# Patient Record
Sex: Female | Born: 1960 | Race: White | Hispanic: No | Marital: Married | Smoking: Former smoker
Health system: Southern US, Community
[De-identification: ages and names within clinical notes are randomized; demographics above are authoritative.]

## PROBLEM LIST (undated history)

## (undated) DIAGNOSIS — E785 Hyperlipidemia, unspecified: Secondary | ICD-10-CM

## (undated) DIAGNOSIS — Z973 Presence of spectacles and contact lenses: Secondary | ICD-10-CM

## (undated) DIAGNOSIS — Z9889 Other specified postprocedural states: Secondary | ICD-10-CM

## (undated) DIAGNOSIS — J3089 Other allergic rhinitis: Secondary | ICD-10-CM

## (undated) DIAGNOSIS — R112 Nausea with vomiting, unspecified: Secondary | ICD-10-CM

## (undated) DIAGNOSIS — K219 Gastro-esophageal reflux disease without esophagitis: Secondary | ICD-10-CM

## (undated) DIAGNOSIS — S83207A Unspecified tear of unspecified meniscus, current injury, left knee, initial encounter: Secondary | ICD-10-CM

## (undated) DIAGNOSIS — N393 Stress incontinence (female) (male): Secondary | ICD-10-CM

---

## 1988-03-19 HISTORY — PX: PILONIDAL CYST EXCISION: SHX744

## 1997-10-18 ENCOUNTER — Other Ambulatory Visit: Admission: RE | Admit: 1997-10-18 | Discharge: 1997-10-18 | Payer: Self-pay | Admitting: Obstetrics & Gynecology

## 1999-05-08 ENCOUNTER — Other Ambulatory Visit: Admission: RE | Admit: 1999-05-08 | Discharge: 1999-05-08 | Payer: Self-pay | Admitting: Obstetrics and Gynecology

## 2000-04-09 ENCOUNTER — Ambulatory Visit (HOSPITAL_COMMUNITY): Admission: RE | Admit: 2000-04-09 | Discharge: 2000-04-09 | Payer: Self-pay | Admitting: Gastroenterology

## 2000-04-09 ENCOUNTER — Encounter: Payer: Self-pay | Admitting: Gastroenterology

## 2000-04-20 ENCOUNTER — Emergency Department (HOSPITAL_COMMUNITY): Admission: EM | Admit: 2000-04-20 | Discharge: 2000-04-20 | Payer: Self-pay | Admitting: Emergency Medicine

## 2000-04-20 ENCOUNTER — Encounter: Payer: Self-pay | Admitting: Emergency Medicine

## 2000-05-02 ENCOUNTER — Ambulatory Visit (HOSPITAL_COMMUNITY): Admission: RE | Admit: 2000-05-02 | Discharge: 2000-05-02 | Payer: Self-pay | Admitting: Gastroenterology

## 2000-05-02 ENCOUNTER — Encounter (INDEPENDENT_AMBULATORY_CARE_PROVIDER_SITE_OTHER): Payer: Self-pay | Admitting: Specialist

## 2000-08-08 ENCOUNTER — Other Ambulatory Visit: Admission: RE | Admit: 2000-08-08 | Discharge: 2000-08-08 | Payer: Self-pay | Admitting: Obstetrics and Gynecology

## 2000-08-09 ENCOUNTER — Other Ambulatory Visit: Admission: RE | Admit: 2000-08-09 | Discharge: 2000-08-09 | Payer: Self-pay | Admitting: Obstetrics and Gynecology

## 2000-08-09 ENCOUNTER — Encounter (INDEPENDENT_AMBULATORY_CARE_PROVIDER_SITE_OTHER): Payer: Self-pay

## 2003-05-13 ENCOUNTER — Encounter: Admission: RE | Admit: 2003-05-13 | Discharge: 2003-05-13 | Payer: Self-pay | Admitting: Obstetrics and Gynecology

## 2003-05-28 ENCOUNTER — Other Ambulatory Visit: Admission: RE | Admit: 2003-05-28 | Discharge: 2003-05-28 | Payer: Self-pay | Admitting: Obstetrics and Gynecology

## 2004-06-15 ENCOUNTER — Encounter: Admission: RE | Admit: 2004-06-15 | Discharge: 2004-06-15 | Payer: Self-pay | Admitting: Obstetrics and Gynecology

## 2004-06-30 ENCOUNTER — Encounter: Admission: RE | Admit: 2004-06-30 | Discharge: 2004-06-30 | Payer: Self-pay | Admitting: Obstetrics and Gynecology

## 2004-07-03 ENCOUNTER — Encounter: Admission: RE | Admit: 2004-07-03 | Discharge: 2004-07-03 | Payer: Self-pay | Admitting: Obstetrics and Gynecology

## 2004-07-03 ENCOUNTER — Encounter (INDEPENDENT_AMBULATORY_CARE_PROVIDER_SITE_OTHER): Payer: Self-pay | Admitting: *Deleted

## 2004-07-28 ENCOUNTER — Ambulatory Visit (HOSPITAL_COMMUNITY): Admission: RE | Admit: 2004-07-28 | Discharge: 2004-07-28 | Payer: Self-pay | Admitting: Surgery

## 2004-07-28 ENCOUNTER — Encounter (INDEPENDENT_AMBULATORY_CARE_PROVIDER_SITE_OTHER): Payer: Self-pay | Admitting: *Deleted

## 2004-07-28 ENCOUNTER — Ambulatory Visit (HOSPITAL_BASED_OUTPATIENT_CLINIC_OR_DEPARTMENT_OTHER): Admission: RE | Admit: 2004-07-28 | Discharge: 2004-07-28 | Payer: Self-pay | Admitting: Surgery

## 2004-07-28 ENCOUNTER — Encounter: Admission: RE | Admit: 2004-07-28 | Discharge: 2004-07-28 | Payer: Self-pay | Admitting: Surgery

## 2004-07-28 HISTORY — PX: OTHER SURGICAL HISTORY: SHX169

## 2004-08-07 ENCOUNTER — Other Ambulatory Visit: Admission: RE | Admit: 2004-08-07 | Discharge: 2004-08-07 | Payer: Self-pay | Admitting: Obstetrics and Gynecology

## 2005-07-27 ENCOUNTER — Encounter: Admission: RE | Admit: 2005-07-27 | Discharge: 2005-07-27 | Payer: Self-pay | Admitting: Obstetrics and Gynecology

## 2005-08-08 ENCOUNTER — Other Ambulatory Visit: Admission: RE | Admit: 2005-08-08 | Discharge: 2005-08-08 | Payer: Self-pay | Admitting: Obstetrics and Gynecology

## 2006-07-29 ENCOUNTER — Encounter: Admission: RE | Admit: 2006-07-29 | Discharge: 2006-07-29 | Payer: Self-pay | Admitting: Obstetrics and Gynecology

## 2007-10-10 ENCOUNTER — Encounter: Admission: RE | Admit: 2007-10-10 | Discharge: 2007-10-10 | Payer: Self-pay | Admitting: Obstetrics and Gynecology

## 2009-04-11 ENCOUNTER — Encounter: Admission: RE | Admit: 2009-04-11 | Discharge: 2009-04-11 | Payer: Self-pay | Admitting: Allergy and Immunology

## 2009-05-28 ENCOUNTER — Ambulatory Visit: Payer: Self-pay | Admitting: Radiology

## 2009-05-28 ENCOUNTER — Emergency Department (HOSPITAL_BASED_OUTPATIENT_CLINIC_OR_DEPARTMENT_OTHER): Admission: EM | Admit: 2009-05-28 | Discharge: 2009-05-28 | Payer: Self-pay | Admitting: Emergency Medicine

## 2009-07-12 ENCOUNTER — Encounter: Admission: RE | Admit: 2009-07-12 | Discharge: 2009-07-12 | Payer: Self-pay | Admitting: Obstetrics and Gynecology

## 2010-04-09 ENCOUNTER — Encounter: Payer: Self-pay | Admitting: Obstetrics and Gynecology

## 2010-04-10 ENCOUNTER — Encounter: Payer: Self-pay | Admitting: Obstetrics and Gynecology

## 2010-07-03 ENCOUNTER — Other Ambulatory Visit: Payer: Self-pay | Admitting: Obstetrics and Gynecology

## 2010-07-03 DIAGNOSIS — Z1231 Encounter for screening mammogram for malignant neoplasm of breast: Secondary | ICD-10-CM

## 2010-07-14 ENCOUNTER — Ambulatory Visit: Payer: Self-pay

## 2010-08-02 ENCOUNTER — Ambulatory Visit
Admission: RE | Admit: 2010-08-02 | Discharge: 2010-08-02 | Disposition: A | Discharge status: Home / Self Care | Payer: BC Managed Care – PPO | Source: Ambulatory Visit | Attending: Obstetrics and Gynecology | Admitting: Obstetrics and Gynecology

## 2010-08-02 DIAGNOSIS — Z1231 Encounter for screening mammogram for malignant neoplasm of breast: Secondary | ICD-10-CM

## 2010-08-04 NOTE — Op Note (Signed)
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   OPERATION:  Needle-guided excision of left breast mass.   SURGEON:  Cyndia Bent, M.D.   ANESTHESIA:  MAC.   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 After discussion  with the patient, she elected to proceed to an excisional biopsy.   DESCRIPTION OF PROCEDURE:  The patient was seen in the holding area and had  no further questions. She had already marked the left breast as the  operative site and I did the same. Her guidewire had already been placed and  I reviewed those films. She had no further questions.   The patient was taken to the operating room, and after satisfactory IV  sedation, the breast was ultrasounded to confirm the location of the mass in  relation to the guidewire. It appeared to be at about the six to seven  o'clock position at the areolar margin. The guide wire entered at about the  8:30 position and tracked from medial towards the nipple and towards the  mass.   After the area was prepped and draped, the time-out occurred. One percent  Xylocaine was then infiltrated and a curvilinear incision at the areolar  margin was made starting at about the 10 o'clock position and going  inferiorly and around to about the 5:30 position. Subcutaneous tissues were  entered and the  guidewire manipulated into the wound. Using cautery, I  traced it into the breast tissue a little ways and then when I saw the  thickened part of the wire, I knew we were very close to the mass. I put a  suture through the distal tissue to use as traction and pulling up on this,  was able to palpate the mass and then using cautery, excised it with narrow  rim of what appeared to be normal breast tissue around it. This was sent for  specimen mammography.   Meanwhile, the wound was further anesthetized to be sure we had good  postoperative pain relief and hemostasis achieved with cautery. It was  closed with 3-0 Vicryl followed by 4-0 Monocryl subcuticular.   Dr. Kearney Hard reported that the mammogram showed the mass within the middle of  the specimen. This concluded the case and Dermabond was applied. The patient  was taken to the recovery room in satisfactory condition. There no operative  complications. All counts were correct.      CJS/MEDQ  D:  07/28/2004  T:  07/28/2004  Job:  [id number]   cc:   Huel Cote, M.D.  [address] [place], [address]  [place], [place] [address]  Fax: [phone]

## 2011-04-21 ENCOUNTER — Encounter (HOSPITAL_BASED_OUTPATIENT_CLINIC_OR_DEPARTMENT_OTHER): Payer: Self-pay | Admitting: *Deleted

## 2011-04-21 ENCOUNTER — Emergency Department (HOSPITAL_BASED_OUTPATIENT_CLINIC_OR_DEPARTMENT_OTHER)
Admission: EM | Admit: 2011-04-21 | Discharge: 2011-04-21 | Disposition: A | Discharge status: Home / Self Care | Payer: BC Managed Care – PPO | Attending: Emergency Medicine | Admitting: Emergency Medicine

## 2011-04-21 DIAGNOSIS — W260XXA Contact with knife, initial encounter: Secondary | ICD-10-CM | POA: Insufficient documentation

## 2011-04-21 DIAGNOSIS — Y92009 Unspecified place in unspecified non-institutional (private) residence as the place of occurrence of the external cause: Secondary | ICD-10-CM | POA: Insufficient documentation

## 2011-04-21 DIAGNOSIS — T07XXXA Unspecified multiple injuries, initial encounter: Secondary | ICD-10-CM

## 2011-04-21 DIAGNOSIS — J45909 Unspecified asthma, uncomplicated: Secondary | ICD-10-CM | POA: Insufficient documentation

## 2011-04-21 DIAGNOSIS — Y93G1 Activity, food preparation and clean up: Secondary | ICD-10-CM | POA: Insufficient documentation

## 2011-04-21 DIAGNOSIS — S61209A Unspecified open wound of unspecified finger without damage to nail, initial encounter: Secondary | ICD-10-CM | POA: Insufficient documentation

## 2011-04-21 MED ORDER — LIDOCAINE HCL (PF) 1 % IJ SOLN
INTRAMUSCULAR | Status: AC
  Administered 2011-04-21: 5 mL via INTRADERMAL
  Filled 2011-04-21: qty 5

## 2011-04-21 MED ORDER — LIDOCAINE HCL (PF) 1 % IJ SOLN
5.0000 mL | Freq: Once | INTRAMUSCULAR | Status: AC
  Administered 2011-04-21: 5 mL via INTRADERMAL
  Filled 2011-04-21: qty 5

## 2011-04-21 MED ORDER — TETANUS-DIPHTH-ACELL PERTUSSIS 5-2.5-18.5 LF-MCG/0.5 IM SUSP
0.5000 mL | Freq: Once | INTRAMUSCULAR | Status: AC
  Administered 2011-04-21: 0.5 mL via INTRAMUSCULAR
  Filled 2011-04-21: qty 0.5

## 2011-04-21 NOTE — ED Provider Notes (Signed)
History     CSN: [id number]  Arrival date & time 04/21/11  2128   First MD Initiated Contact with Patient 04/21/11 2141      Chief Complaint  Patient presents with  . 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 There was no treatment prior to arrival other than localized pressure to control the bleeding. This injury occurred just prior to arrival. Past Medical History  Diagnosis Date  . Asthma     Past Surgical History  Procedure Date  . Cesarean section     History reviewed. No pertinent family history.  History  Substance Use Topics  . Smoking status: Never Smoker   . Smokeless tobacco: Not on file  . Alcohol Use: Yes    OB History    Grav Para Term Preterm Abortions TAB SAB Ect Mult Living                  Review of Systems  Constitutional: Negative for fever.       10 Systems reviewed and are negative for acute change except as noted in the HPI.  Respiratory: Negative for shortness of breath.   Cardiovascular: Negative for chest pain.  Gastrointestinal: Negative for abdominal pain.  Musculoskeletal: Negative for back pain.  Skin: Negative for rash.  Neurological: Negative for weakness and numbness.  Psychiatric/Behavioral:       No behavior change.    Allergies  Review of patient's allergies indicates no known allergies.  Home Medications   Current Outpatient Rx  Name Route Sig Dispense Refill  .  ALBUTEROL SULFATE HFA 108 (90 BASE) MCG/ACT IN AERS Inhalation Inhale 2 puffs into the lungs every 6 (six) hours as needed. For shortness of breath and wheezing    . BUDESONIDE-FORMOTEROL FUMARATE 160-4.5 MCG/ACT IN AERO Inhalation Inhale 2 puffs into the lungs 2 (two) times daily.    . IBUPROFEN 200 MG PO TABS Oral Take 400 mg by mouth every 6 (six) hours as needed. For pain    . ADULT MULTIVITAMIN W/MINERALS CH Oral Take 1 tablet by mouth daily.      BP 124/44  Pulse 78  Temp(Src) 97.7 F (36.5 C) (Oral)  Resp 20  Ht 6' (1.829 m)  Wt 254 lb (115.214 kg)  BMI 34.45 kg/m2  SpO2 100%  LMP 03/14/2011  Physical Exam  Nursing note and vitals reviewed. Constitutional:       Awake, alert, nontoxic appearance.  HENT:  Head: Atraumatic.  Eyes: Right eye exhibits no discharge. Left eye exhibits no discharge.  Neck: Neck supple.  Pulmonary/Chest: Effort normal. She exhibits no tenderness.  Abdominal: Soft. There is no tenderness. There is no rebound.  Musculoskeletal: She exhibits no tenderness.       Baseline ROM, no obvious new focal weakness. Right arm and both legs are nontender, left arm is minimal tenderness to the dorsum of the lacerated fingers otherwise is nontender. Examination of left hand reveals no bony or joint tenderness. She has capillary refill less than 2  seconds in all digits. She has 2 point discrimination intact in her affected small ring and long fingers. Her thumb and index finger are nontender and not injured. Her left long ring and small finger all have dorsal lacerations with full extension against resistance at 5 out of 5 strength as well as FDP and FDS strength testing 5/5 against resistance as well. The lacerations are 1 cm each on her small and long fingers and 2 cm on her ring finger.  Neurological: She is alert.       Mental status and motor strength appears baseline for patient and situation.  Skin: No rash noted.  Psychiatric: She has a normal mood and affect.      ED Course  Procedures (including critical care time) After time out was taken the patient's left hand was cleansed irrigated and wounds were anesthetized with 1% lidocaine for local anesthesia the wounds were explored with good hemostasis to range of motion no deeper structure involvement was noted such as bone joint or tendon or neurovascular involvement noted the wounds were then closed with 5-0 nylon 3 sutures each to the long and small fingers and 5 sutures to the ring finger which the patient tolerated well with no immediate complications noted for a total laceration length repair of 4 cm between the 3 affected digits. Labs Reviewed - No data to display No results found.   1. Lacerations of multiple sites without complication       MDM          Hurman Horn, MD 04/22/11 [phone]

## 2011-04-21 NOTE — ED Notes (Signed)
Pt states she was using a knife and accidentally cut 3 of her fingers. Approx 1 cm lacs to each. Bleeding controlled.

## 2012-12-17 ENCOUNTER — Emergency Department (HOSPITAL_COMMUNITY): Payer: BC Managed Care – PPO

## 2012-12-17 ENCOUNTER — Encounter (HOSPITAL_COMMUNITY): Payer: Self-pay | Admitting: Emergency Medicine

## 2012-12-17 ENCOUNTER — Emergency Department (HOSPITAL_COMMUNITY)
Admission: EM | Admit: 2012-12-17 | Discharge: 2012-12-17 | Disposition: A | Discharge status: Home / Self Care | Payer: BC Managed Care – PPO | Attending: Emergency Medicine | Admitting: Emergency Medicine

## 2012-12-17 DIAGNOSIS — R079 Chest pain, unspecified: Secondary | ICD-10-CM | POA: Insufficient documentation

## 2012-12-17 DIAGNOSIS — G43909 Migraine, unspecified, not intractable, without status migrainosus: Secondary | ICD-10-CM | POA: Insufficient documentation

## 2012-12-17 DIAGNOSIS — Z79899 Other long term (current) drug therapy: Secondary | ICD-10-CM | POA: Insufficient documentation

## 2012-12-17 DIAGNOSIS — J45909 Unspecified asthma, uncomplicated: Secondary | ICD-10-CM | POA: Insufficient documentation

## 2012-12-17 DIAGNOSIS — M7989 Other specified soft tissue disorders: Secondary | ICD-10-CM | POA: Insufficient documentation

## 2012-12-17 DIAGNOSIS — R0602 Shortness of breath: Secondary | ICD-10-CM | POA: Insufficient documentation

## 2012-12-17 DIAGNOSIS — Z91040 Latex allergy status: Secondary | ICD-10-CM | POA: Insufficient documentation

## 2012-12-17 LAB — POCT I-STAT TROPONIN I

## 2012-12-17 LAB — CBC
MCHC: 32.1 g/dL (ref 30.0–36.0)
RBC: 4.52 MIL/uL (ref 3.87–5.11)

## 2012-12-17 LAB — BASIC METABOLIC PANEL
Chloride: 100 mEq/L (ref 96–112)
Creatinine, Ser: 0.75 mg/dL (ref 0.50–1.10)
Potassium: 3.6 mEq/L (ref 3.5–5.1)

## 2012-12-17 MED ORDER — PROMETHAZINE HCL 25 MG/ML IJ SOLN
12.5000 mg | Freq: Once | INTRAMUSCULAR | Status: AC
  Administered 2012-12-17: 12.5 mg via INTRAVENOUS
  Filled 2012-12-17 (×2): qty 1

## 2012-12-17 MED ORDER — IOHEXOL 350 MG/ML SOLN
100.0000 mL | Freq: Once | INTRAVENOUS | Status: AC | PRN
  Administered 2012-12-17: 100 mL via INTRAVENOUS

## 2012-12-17 NOTE — ED Notes (Signed)
Pt states that her left arm has been feeling tight for the past couple of days, this morning pt's chest started hurting felt as if something was sitting on chest. Pt states that she is SOB on exertion at times.

## 2012-12-17 NOTE — ED Provider Notes (Signed)
CSN: [id number]     Arrival date & time 12/17/12  1191 History   First MD Initiated Contact with Patient 12/17/12 0901     Chief Complaint  Patient presents with  . Chest Pain   (Consider location/radiation/quality/duration/timing/severity/associated sxs/prior Treatment) Patient is a 52 y.o. female presenting with chest pain. The history is provided by the patient.  Chest Pain Associated symptoms: headache and shortness of breath   Associated symptoms: no abdominal pain, no back pain, no nausea, no numbness, not vomiting and no weakness    patient presents with right-sided chest pain. As dull. She states that she's had some left arm pain for last few days. She states today at work her arm is bothering her and she developed a left-sided headache. Patient states she's been having difficulty with activity for a couple weeks. She states is no chest pain with it. No fevers. She'll have a nonproductive cough. She states it is her asthma. No production with the cough. She developed right-sided chest pain today. She's also had a throbbing left-sided headache. She states it feels as if she is being stabbed her head and hurts with every heartbeat. She has a history of migraines but states this feels different. No numbness or weakness. No difficulty seen, however the light does make her headache worse. No nausea vomiting. She also states she's had swelling in both her legs. She states is worse on the left side. She states she's been giving fluid pills for this. She states this has been going on for a few weeks also. Patient's father has had a CABG at 63 years old.  Past Medical History  Diagnosis Date  . Asthma    Past Surgical History  Procedure Laterality Date  . Cesarean section     No family history on file. History  Substance Use Topics  . Smoking status: Never Smoker   . Smokeless tobacco: Not on file  . Alcohol Use: Yes   OB History   Grav Para Term Preterm Abortions TAB SAB Ect Mult  Living                 Review of Systems  Constitutional: Negative for activity change and appetite change.  HENT: Negative for neck stiffness.   Eyes: Negative for pain.  Respiratory: Positive for shortness of breath. Negative for chest tightness.   Cardiovascular: Positive for chest pain and leg swelling.  Gastrointestinal: Negative for nausea, vomiting, abdominal pain and diarrhea.  Genitourinary: Negative for flank pain.  Musculoskeletal: Negative for back pain.  Skin: Negative for rash.  Neurological: Positive for headaches. Negative for weakness and numbness.  Psychiatric/Behavioral: Negative for behavioral problems.    Allergies  Latex  Home Medications   Current Outpatient Rx  Name  Route  Sig  Dispense  Refill  . acetaminophen (TYLENOL) 500 MG tablet   Oral   Take 1,000 mg by mouth every 6 (six) hours as needed for pain (headache).         Marland Kitchen albuterol (PROVENTIL HFA;VENTOLIN HFA) 108 (90 BASE) MCG/ACT inhaler   Inhalation   Inhale 2 puffs into the lungs every 6 (six) hours as needed. For shortness of breath and wheezing         . aspirin-acetaminophen-caffeine (EXCEDRIN MIGRAINE) 250-250-65 MG per tablet   Oral   Take 2 tablets by mouth every 6 (six) hours as needed for pain.         . budesonide-formoterol (SYMBICORT) 160-4.5 MCG/ACT inhaler   Inhalation   Inhale 2  puffs into the lungs 2 (two) times daily.          BP 109/56  Pulse 75  Temp(Src) 98.9 F (37.2 C) (Oral)  Resp 20  SpO2 100%  LMP 03/14/2011 Physical Exam  Nursing note and vitals reviewed. Constitutional: She is oriented to person, place, and time. She appears well-developed and well-nourished.  HENT:  Head: Normocephalic and atraumatic.  Eyes: EOM are normal. Pupils are equal, round, and reactive to light.  Neck: Normal range of motion. Neck supple.  Cardiovascular: Normal rate, regular rhythm and normal heart sounds.   No murmur heard. Pulmonary/Chest: Effort normal and breath  sounds normal. No respiratory distress. She has no wheezes. She has no rales.  Abdominal: Soft. Bowel sounds are normal. She exhibits no distension. There is no tenderness. There is no rebound and no guarding.  Musculoskeletal: Normal range of motion. She exhibits edema.  Pitting edema to bilateral lower extremities. Worse on the left.  Neurological: She is alert and oriented to person, place, and time. No cranial nerve deficit.  Skin: Skin is warm and dry.  Psychiatric: She has a normal mood and affect. Her speech is normal.    ED Course  Procedures (including critical care time) Labs Review Labs Reviewed  BASIC METABOLIC PANEL - Abnormal; Notable for the following:    Glucose, Bld 105 (*)    All other components within normal limits  CBC  PRO B NATRIURETIC PEPTIDE  POCT I-STAT TROPONIN I  POCT I-STAT TROPONIN I   Imaging Review Dg Chest 2 View  12/17/2012   CLINICAL DATA:  Chest pain  EXAM: CHEST  2 VIEW  COMPARISON:  04/11/2009  FINDINGS: Lungs are clear. No pleural effusion or pneumothorax.  The heart is normal in size.  Visualized osseous structures are within normal limits.  IMPRESSION: No evidence of acute cardiopulmonary disease.   Electronically Signed   By: Charline Bills M.D.   On: 12/17/2012 09:46   Ct Angio Chest W/cm &/or Wo Cm  12/17/2012   CLINICAL DATA:  Shortness of breath, cough, and left leg pain.  EXAM: CT ANGIOGRAPHY CHEST WITH CONTRAST  TECHNIQUE: Multidetector CT imaging of the chest was performed using the standard protocol during bolus administration of intravenous contrast. Multiplanar CT image reconstructions including MIPs were obtained to evaluate the vascular anatomy.  CONTRAST:  OMNIPAQUE IOHEXOL 350 MG/ML SOLN  COMPARISON:  Chest x-ray dated 12/17/2012  FINDINGS: There are no pulmonary emboli, infiltrates, or effusions. Heart size is within normal limits. No osseous abnormality. There is a small amount of pericardial fluid around the root of the  main pulmonary artery and thoracic aorta. No adenopathy. The visualized portion of the upper abdomen is normal.  Review of the MIP images confirms the above findings.  IMPRESSION: No pulmonary emboli or other significant abnormalities.   Electronically Signed   By: Geanie Cooley   On: 12/17/2012 11:28    Date: 12/17/2012  Rate: 83   Rhythm: normal sinus rhythm  QRS Axis: normal  Intervals: normal  ST/T Wave abnormalities: normal  Conduction Disutrbances: none  Narrative Interpretation: unremarkable    MDM   1. Chest pain    Patient with pain in her right chest and left upper extremity. Also had a headache. Feels better after treatment. EKG reassuring. CT angiography done due 2 right-sided pain. No pulmonary embolism. Cardiac enzymes negative x2. Will discharge and follow with cardiology.    Juliet Rude. Rubin Payor, MD 12/17/12 1331

## 2012-12-25 ENCOUNTER — Other Ambulatory Visit: Payer: Self-pay

## 2012-12-25 DIAGNOSIS — Z1231 Encounter for screening mammogram for malignant neoplasm of breast: Secondary | ICD-10-CM

## 2012-12-31 ENCOUNTER — Ambulatory Visit: Payer: BC Managed Care – PPO | Admitting: Cardiovascular Disease

## 2013-01-02 ENCOUNTER — Encounter (HOSPITAL_COMMUNITY): Payer: Self-pay | Admitting: *Deleted

## 2013-01-02 ENCOUNTER — Encounter: Payer: Self-pay | Admitting: Cardiovascular Disease

## 2013-01-02 ENCOUNTER — Ambulatory Visit (INDEPENDENT_AMBULATORY_CARE_PROVIDER_SITE_OTHER): Payer: BC Managed Care – PPO | Admitting: Cardiovascular Disease

## 2013-01-02 VITALS — BP 138/80 | Ht 71.0 in | Wt 275.9 lb

## 2013-01-02 DIAGNOSIS — R079 Chest pain, unspecified: Secondary | ICD-10-CM

## 2013-01-02 DIAGNOSIS — E669 Obesity, unspecified: Secondary | ICD-10-CM

## 2013-01-02 DIAGNOSIS — R002 Palpitations: Secondary | ICD-10-CM

## 2013-01-02 DIAGNOSIS — K219 Gastro-esophageal reflux disease without esophagitis: Secondary | ICD-10-CM

## 2013-01-02 DIAGNOSIS — N951 Menopausal and female climacteric states: Secondary | ICD-10-CM

## 2013-01-02 DIAGNOSIS — Z8249 Family history of ischemic heart disease and other diseases of the circulatory system: Secondary | ICD-10-CM

## 2013-01-02 MED ORDER — PANTOPRAZOLE SODIUM 40 MG PO TBEC: 40.0000 mg | DELAYED_RELEASE_TABLET | Freq: Every day | ORAL | Status: DC

## 2013-01-02 NOTE — Patient Instructions (Addendum)
Your physician has requested that you have an echocardiogram. Echocardiography is a painless test that uses sound waves to create images of your heart. It provides your doctor with information about the size and shape of your heart and how well your heart's chambers and valves are working. This procedure takes approximately one hour. There are no restrictions for this procedure.  Your physician has requested that you have en exercise stress myoview. For further information please visit [email or web address] Please follow instruction sheet, as given.   Your physician recommends that you return for lab work fasting prior to next appointment.  Your physician has recommended you make the following change in your medication: start new prescription for pantoprazole. This hs been sent to your pharmacy.  Your physician recommends that you schedule a follow-up appointment in: 4 WEEKS.

## 2013-01-05 LAB — BASIC METABOLIC PANEL
CO2: 27 mEq/L (ref 19–32)
Calcium: 8.7 mg/dL (ref 8.4–10.5)
Chloride: 104 mEq/L (ref 96–112)
Glucose, Bld: 90 mg/dL (ref 70–99)
Potassium: 4.4 mEq/L (ref 3.5–5.3)
Sodium: 139 mEq/L (ref 135–145)

## 2013-01-05 LAB — MAGNESIUM: Magnesium: 1.9 mg/dL (ref 1.5–2.5)

## 2013-01-06 LAB — NMR LIPOPROFILE WITH LIPIDS
Cholesterol, Total: 188 mg/dL (ref <200)
HDL Particle Number: 31.5 umol/L (ref >=30.5)
HDL Size: 9.1 nm — ABNORMAL LOW (ref >=9.2)
HDL-C: 49 mg/dL (ref >=40)
Large HDL-P: 6.9 umol/L (ref >=4.8)
Large VLDL-P: 6.2 nmol/L — ABNORMAL HIGH (ref <=2.7)
Triglycerides: 136 mg/dL (ref <150)

## 2013-01-07 ENCOUNTER — Ambulatory Visit (HOSPITAL_COMMUNITY)
Admission: RE | Admit: 2013-01-07 | Discharge: 2013-01-07 | Disposition: A | Discharge status: Home / Self Care | Payer: BC Managed Care – PPO | Source: Ambulatory Visit | Attending: Cardiovascular Disease | Admitting: Cardiovascular Disease

## 2013-01-07 DIAGNOSIS — R5381 Other malaise: Secondary | ICD-10-CM | POA: Insufficient documentation

## 2013-01-07 DIAGNOSIS — R0602 Shortness of breath: Secondary | ICD-10-CM | POA: Insufficient documentation

## 2013-01-07 DIAGNOSIS — Z8249 Family history of ischemic heart disease and other diseases of the circulatory system: Secondary | ICD-10-CM

## 2013-01-07 DIAGNOSIS — M79609 Pain in unspecified limb: Secondary | ICD-10-CM | POA: Insufficient documentation

## 2013-01-07 DIAGNOSIS — R0989 Other specified symptoms and signs involving the circulatory and respiratory systems: Secondary | ICD-10-CM | POA: Insufficient documentation

## 2013-01-07 DIAGNOSIS — R002 Palpitations: Secondary | ICD-10-CM | POA: Insufficient documentation

## 2013-01-07 DIAGNOSIS — R079 Chest pain, unspecified: Secondary | ICD-10-CM

## 2013-01-07 DIAGNOSIS — R0609 Other forms of dyspnea: Secondary | ICD-10-CM | POA: Insufficient documentation

## 2013-01-07 HISTORY — PX: CARDIOVASCULAR STRESS TEST: SHX262

## 2013-01-07 MED ORDER — TECHNETIUM TC 99M SESTAMIBI GENERIC - CARDIOLITE
30.8000 | Freq: Once | INTRAVENOUS | Status: AC | PRN
  Administered 2013-01-07: 30.8 via INTRAVENOUS

## 2013-01-07 MED ORDER — TECHNETIUM TC 99M SESTAMIBI GENERIC - CARDIOLITE
10.6000 | Freq: Once | INTRAVENOUS | Status: AC | PRN
  Administered 2013-01-07: 11 via INTRAVENOUS

## 2013-01-07 NOTE — Procedures (Addendum)
[facility] [address] [place] [address] [phone]  Cardiology Nuclear Med Study  Melinda Reeves is a 52 y.o. female     MRN : [id number]     DOB: 1960-09-12  Procedure Date: 01/07/2013  Nuclear Med Background Indication for Stress Test:  Evaluation for Ischemia and Post Hospital History:  Asthma Cardiac Risk Factors: Family History - CAD  Symptoms:  Chest Pain, DOE, Fatigue, Palpitations, SOB and L arm pain.   Nuclear Pre-Procedure Caffeine/Decaff Intake:  7:00pm NPO After: 5:00am   IV Site: R Hand  IV 0.9% NS with Angio Cath:  22g  Chest Size (in):  n/a IV Started by: Emmit Pomfret, RN  Height: 5\' 11"  (1.803 m)  Cup Size: D  BMI:  Body mass index is 38.37 kg/(m^2). Weight:  275 lb (124.739 kg)   Tech Comments:  n/a    Nuclear Med Study 1 or 2 day study: 1 day  Stress Test Type:  Stress  Order Authorizing Provider:  Nicki Guadalajara, MD   Resting Radionuclide: Technetium 53m Sestamibi  Resting Radionuclide Dose: 10.6 mCi   Stress Radionuclide:  Technetium 36m Sestamibi  Stress Radionuclide Dose: 30.8 mCi           Stress Protocol Rest HR: 62 Stress HR: 151  Rest BP: 112/58 Stress BP: 144/89  Exercise Time (min): 6:45 METS: 7.00   Predicted Max HR: 168 bpm % Max HR: 89.88 bpm Rate Pressure Product: 84132  Dose of Adenosine (mg):  n/a Dose of Lexiscan: n/a mg  Dose of Atropine (mg): n/a Dose of Dobutamine: n/a mcg/kg/min (at max HR)  Stress Test Technologist: Ernestene Mention, CCT Nuclear Technologist: Gonzella Lex, CNMT   Rest Procedure:  Myocardial perfusion imaging was performed at rest 45 minutes following the intravenous administration of Technetium 37m Sestamibi. Stress Procedure:  The patient performed treadmill exercise using a Bruce  Protocol for 6 minutes and 45 seconds. The patient stopped due to shortness of breath and fatigue. Patient denied any chest pain.  There were no significant ST-T wave  changes.  Technetium 21m Sestamibi was injected at peak exercise and myocardial perfusion imaging was performed after a brief delay.  Transient Ischemic Dilatation (Normal <1.22):  0.89 Lung/Heart Ratio (Normal <0.45):  0.21 QGS EDV:  92 ml QGS ESV:  28 ml LV Ejection Fraction: 69%     Rest ECG: NSR - Normal EKG  Stress ECG: No significant change from baseline ECG  QPS Raw Data Images:  Normal; no motion artifact; normal heart/lung ratio. Stress Images:  Normal homogeneous uptake in all areas of the myocardium. Rest Images:  Normal homogeneous uptake in all areas of the myocardium. Subtraction (SDS):  No evidence of ischemia. LV Wall Motion:  NL LV Function; NL Wall Motion  Impression Exercise Capacity:  Fair exercise capacity. BP Response:  Normal blood pressure response. Clinical Symptoms:  No significant symptoms noted. ECG Impression:  No significant ST segment change suggestive of ischemia. Comparison with Prior Nuclear Study: No previous nuclear study performed   Overall Impression:  Normal stress nuclear study.   [name], MD  01/07/2013 1:34 PM

## 2013-01-13 ENCOUNTER — Telehealth: Payer: Self-pay | Admitting: Cardiovascular Disease

## 2013-01-13 ENCOUNTER — Ambulatory Visit
Admission: RE | Admit: 2013-01-13 | Discharge: 2013-01-13 | Disposition: A | Discharge status: Home / Self Care | Payer: BC Managed Care – PPO | Source: Ambulatory Visit

## 2013-01-13 DIAGNOSIS — Z1231 Encounter for screening mammogram for malignant neoplasm of breast: Secondary | ICD-10-CM

## 2013-01-13 NOTE — Telephone Encounter (Signed)
Wanted to get results of stress test. Please call

## 2013-01-13 NOTE — Telephone Encounter (Signed)
Forwarded to Hope to advise.

## 2013-01-14 NOTE — Telephone Encounter (Signed)
Spoke with patient informing her that Dr.kelly hasn't given me the results of her stress test, however  when it was read if there were anything alarming it would have been flagged as a high risk study. He will discuss the findings at her November appointment unless there is something that she needs to know prior to her visit. Patient voiced understanding.

## 2013-01-16 NOTE — Progress Notes (Signed)
Quick Note:  Released into my chart ______ 

## 2013-01-17 ENCOUNTER — Encounter: Payer: Self-pay | Admitting: Cardiovascular Disease

## 2013-01-17 DIAGNOSIS — Z8249 Family history of ischemic heart disease and other diseases of the circulatory system: Secondary | ICD-10-CM | POA: Insufficient documentation

## 2013-01-17 DIAGNOSIS — R079 Chest pain, unspecified: Secondary | ICD-10-CM | POA: Insufficient documentation

## 2013-01-17 DIAGNOSIS — N951 Menopausal and female climacteric states: Secondary | ICD-10-CM | POA: Insufficient documentation

## 2013-01-17 DIAGNOSIS — E669 Obesity, unspecified: Secondary | ICD-10-CM | POA: Insufficient documentation

## 2013-01-17 DIAGNOSIS — K219 Gastro-esophageal reflux disease without esophagitis: Secondary | ICD-10-CM | POA: Insufficient documentation

## 2013-01-17 NOTE — Progress Notes (Signed)
Patient ID: Melinda Reeves, female   DOB: Oct 02, 1960, 52 y.o.   MRN: [id number]     PATIENT PROFILE: Melinda Reeves is a 52 year old female who presents for evaluation of left-sided chest pain as well as occasional episodes of palpitations. Her primary physician is Dr. Leonides Sake. I have been taking care of her father for many years.  HPI: Melinda Reeves is a 52 year old female who denies any known cardiac history. There is a very strong family history for premature coronary artery disease in her father is status post CBG revascularization surgery and has a documented abdominal aortic aneurysm. Melinda Reeves is perimenopausal with her last period being in December 2013. She recently has experienced episodes of left-sided chest pain. She also has noticed mild episodes of shortness of breath. She has had decreased energy. She notes occasional palpitations for several weeks which typically lasts 15 seconds and self abate. She does have a history of GERD for which he takes TUMS almost on a daily basis. She also has noted some mild headaches associated with her chest pain. She does note some mild cough. With her recent chest pain symptoms she had presented to, emergency room on 12/17/2012 and was evaluated by Dr. Benjiman Core. Her ECG was unremarkable. A CT angiogram was done due to her right-sided pain and she was not felt to have a pulmonary embolism. Cardiac enzymes were negative x2. She now presents for cardiology follow up evaluation.   Past Medical History  Diagnosis Date  . Asthma     Past Surgical History  Procedure Laterality Date  . Cesarean section      Allergies  Allergen Reactions  . Latex     Current Outpatient Prescriptions  Medication Sig Dispense Refill  . acetaminophen (TYLENOL) 500 MG tablet Take 1,000 mg by mouth every 6 (six) hours as needed for pain (headache).      Marland Kitchen albuterol (PROVENTIL HFA;VENTOLIN HFA) 108 (90 BASE) MCG/ACT inhaler Inhale 2 puffs into the lungs every 6 (six)  hours as needed. For shortness of breath and wheezing      . aspirin-acetaminophen-caffeine (EXCEDRIN MIGRAINE) 250-250-65 MG per tablet Take 2 tablets by mouth every 6 (six) hours as needed for pain.      . budesonide-formoterol (SYMBICORT) 160-4.5 MCG/ACT inhaler Inhale 2 puffs into the lungs 2 (two) times daily.      . pantoprazole (PROTONIX) 40 MG tablet Take 1 tablet (40 mg total) by mouth daily.  30 tablet  11   No current facility-administered medications for this visit.    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 She is married for 23 years. She has one child. There is no tobacco use. She does drink occasional white wine. She does not routinely exercise.  Family History  Problem Relation Age of Onset  . Heart disease Father   . COPD Maternal Grandmother   . Cancer - Colon Maternal Grandfather   . Heart disease Paternal Grandfather     ROS is negative for fever chills or night sweats. She admits to a 40 pound weight gain since December of last year. She denies any significant change in activity. She does admit to occasional headaches. She denies any neck stiffness. There are no visual symptoms. She does have a history of mild asthma. She does admit to a cough which may be secondary to reflux. She denies recent wheezing. She denies sputum changes. She denies presyncope or  syncope. Her chest pain is left-sided and nonexertional. She denies GI issues. She denies genitourinary issues. She is perimenopausal. She denies any musculoskeletal issues. She denies any rashes.  She denies paresthesias or muscle weakness. She denies any bleeding. She denies any psychiatric or behavioral issues. She denies any significant sleep issues. Other comprehensive 14 point system review is negative.  PE BP 138/80  Ht 5\' 11"  (1.803 m)  Wt 275 lb 14.4 oz (125.147 kg)  BMI 38.5 kg/m2  LMP 03/14/2011 General: Alert, oriented,  no distress. Moderate obesity Skin: normal turgor, no rashes HEENT: Normocephalic, atraumatic. Pupils round and reactive; sclera anicteric; Fundi normal without hemorrhages or exudates. Nose without nasal septal hypertrophy Mouth/Parynx benign; Mallinpatti scale 3 Neck: No JVD, no carotid briuts Lungs: clear to ausculatation and percussion; no wheezing or rales Chest wall with definite left costochondral tenderness to palpation suggestive of musculoskeletal chest pain. Heart: RRR, s1 s2 normal 1/6 systolic murmur. No S3 gallop. Abdomen: soft, nontender; no hepatosplenomehaly, BS+; abdominal aorta nontender and not dilated by palpation. Pulses 2+ Extremities: no clubbinbg cyanosis or edema, Homan's sign negative  Neurologic: grossly nonfocal Psychologic: Normal affect and mood.   ECG: Normal sinus rhythm at 76. Normal intervals. QTc interval 445. No significant ST changes.  Epworth Sleepiness Scale score:  calculated at 3 arguing against daytime sleepiness.  LABS:  BMET    Component Value Date/Time   NA 139 01/05/2013 0828   K 4.4 01/05/2013 0828   CL 104 01/05/2013 0828   CO2 27 01/05/2013 0828   GLUCOSE 90 01/05/2013 0828   BUN 18 01/05/2013 0828   CREATININE 0.69 01/05/2013 0828   CREATININE 0.75 12/17/2012 0845   CALCIUM 8.7 01/05/2013 0828   GFRNONAA >90 12/17/2012 0845   GFRAA >90 12/17/2012 0845     Hepatic Function Panel  No results found for this basename: prot, albumin, ast, alt, alkphos, bilitot, bilidir, ibili     CBC    Component Value Date/Time   WBC 7.5 12/17/2012 0845   RBC 4.52 12/17/2012 0845   HGB 12.0 12/17/2012 0845   HCT 37.4 12/17/2012 0845   PLT 315 12/17/2012 0845   MCV 82.7 12/17/2012 0845   MCH 26.5 12/17/2012 0845   MCHC 32.1 12/17/2012 0845   RDW 14.5 12/17/2012 0845     BNP    Component Value Date/Time   PROBNP 22.8 12/17/2012 0845    Lipid Panel     Component Value Date/Time   TRIG 136 01/05/2013 0828   LDLCALC 112* 01/05/2013 0828      RADIOLOGY: Mm Digital Screening  01/13/2013   CLINICAL DATA:  Screening.  EXAM: DIGITAL SCREENING BILATERAL MAMMOGRAM WITH CAD  COMPARISON:  Previous exam(s).  ACR Breast Density Category b: There are scattered areas of fibroglandular density.  FINDINGS: There are no findings suspicious for malignancy. Images were processed with CAD.  IMPRESSION: No mammographic evidence of malignancy. A result letter of this screening mammogram will be mailed directly to the patient.  RECOMMENDATION: Screening mammogram in one year. (Code:SM-B-01Y)  BI-RADS CATEGORY  1: Negative   Electronically Signed   By: Anselmo Pickler M.D.   On: 01/13/2013 15:58     ASSESSMENT AND PLAN: My impression is that Melinda Reeves is a 52 year old female who is perimenopausal. She has gained approximately 40 pounds over the past year or. She presents today with episodic chest pain which appears to be musculoskeletal in etiology. She recently underwent an evaluation at the emergency room. A chest CT was negative.  Her EKG today is unremarkable. Electing to recommend initial therapy with nonsteroidal anti-inflammatory treatment. She does have cough which may be secondary to GERD symptoms. She takes TUMS on a daily basis. I'm recommending the addition of Protonix 40 mg daily. Additional laboratory will be checked consisting of a Bmet on NSAIDx,  NMR lipoprofile, magnesium level and TSH. With her family history for premature coronary artery disease I'm also scheduling her for a  exercise myocardial perfusion study and am scheduling her for a 2-D echo Doppler study. We also discussed the 40 pound weight gain and potentially have this particularly in a postmenopausal patient can contribute to obstructive sleep apnea. We discussed exercise and weight reduction.  I will see her in 4 weeks for followup evaluation and further recommendations will be made at that time.   Lennette Bihari, MD, [facility] 01/17/2013 1:08 PM

## 2013-01-19 ENCOUNTER — Ambulatory Visit (HOSPITAL_COMMUNITY)
Admission: RE | Admit: 2013-01-19 | Discharge: 2013-01-19 | Disposition: A | Discharge status: Home / Self Care | Payer: BC Managed Care – PPO | Source: Ambulatory Visit | Attending: Cardiovascular Disease | Admitting: Cardiovascular Disease

## 2013-01-19 DIAGNOSIS — Z8249 Family history of ischemic heart disease and other diseases of the circulatory system: Secondary | ICD-10-CM

## 2013-01-19 DIAGNOSIS — R079 Chest pain, unspecified: Secondary | ICD-10-CM | POA: Insufficient documentation

## 2013-01-19 DIAGNOSIS — R002 Palpitations: Secondary | ICD-10-CM | POA: Insufficient documentation

## 2013-01-19 HISTORY — PX: TRANSTHORACIC ECHOCARDIOGRAM: SHX275

## 2013-01-19 NOTE — Progress Notes (Signed)
2D Echo Performed 01/19/2013    Clearence Ped, RCS

## 2013-01-20 ENCOUNTER — Encounter: Payer: Self-pay | Admitting: Cardiovascular Disease

## 2013-01-22 ENCOUNTER — Other Ambulatory Visit: Payer: Self-pay

## 2013-01-26 NOTE — Progress Notes (Signed)
Quick Note:  Results released into mychart. ______ 

## 2013-01-29 ENCOUNTER — Encounter: Payer: Self-pay | Admitting: Cardiovascular Disease

## 2013-01-29 ENCOUNTER — Ambulatory Visit (INDEPENDENT_AMBULATORY_CARE_PROVIDER_SITE_OTHER): Payer: BC Managed Care – PPO | Admitting: Cardiovascular Disease

## 2013-01-29 VITALS — BP 110/70 | HR 72 | Ht 71.0 in | Wt 278.7 lb

## 2013-01-29 DIAGNOSIS — Z8249 Family history of ischemic heart disease and other diseases of the circulatory system: Secondary | ICD-10-CM

## 2013-01-29 DIAGNOSIS — E785 Hyperlipidemia, unspecified: Secondary | ICD-10-CM

## 2013-01-29 MED ORDER — HYDROCHLOROTHIAZIDE 12.5 MG PO CAPS: ORAL_CAPSULE | ORAL | Status: DC

## 2013-01-29 NOTE — Progress Notes (Signed)
Patient ID: Shaolin Armas, female   DOB: [birth year], 52 y.o.   MRN: [id number]     HPI:  Ms. Girtrude Enslin is a 52 year old female who presents for followup evaluation of left-sided chest pain as well as occasional episodes of palpitations.   Ms. Weisbecker is a 52 year old female who denies any known cardiac history. There is a very strong family history for premature coronary artery disease in her father is status post CBG revascularization surgery and has a documented abdominal aortic aneurysm. Ms. Schlafer is perimenopausal with her last period being in December 2013. She recently has experienced episodes of left-sided chest pain. She also has noticed mild episodes of shortness of breath. She has had decreased energy. She notes occasional palpitations for several weeks which typically lasts 15 seconds and self abate. She does have a history of GERD for which he takes TUMS almost on a daily basis. She also has noted some mild headaches associated with her chest pain. She does note some mild cough. With her recent chest pain symptoms she had an emergency room evaluation on 12/17/2012 and was evaluated by Dr. Benjiman Core. Her ECG was unremarkable. A CT angiogram was done due to her right-sided pain and she was not felt to have a pulmonary embolism. Cardiac enzymes were negative x2. She now presents for cardiology follow up evaluation.  When I initially saw her on 01/02/2013, I recommended that she undergo a 2-D echo Doppler study as well as a nuclear perfusion scan. Due to concerns of GERD, I also empirically started her on Protonix 40 mg daily. She states that since she's been on Protonix she has had complete resolution of her prior symptomatology. She has not required any use of TUMS. She has not been successful in weight loss. I also performed NMR profile of her lipids and even though her cholesterol was 188, triglycerides 136, HDL 49, and LDL 112, she did have moderately increased LDL particle number of 1675  nmol per liter. She did have increased small LDL particle numbers 684. She presents now for evaluation.   Past Medical History  Diagnosis Date  . Asthma     Past Surgical History  Procedure Laterality Date  . Cesarean section      Allergies  Allergen Reactions  . Latex     Current Outpatient Prescriptions  Medication Sig Dispense Refill  . acetaminophen (TYLENOL) 500 MG tablet Take 1,000 mg by mouth every 6 (six) hours as needed for pain (headache).      Marland Kitchen albuterol (PROVENTIL HFA;VENTOLIN HFA) 108 (90 BASE) MCG/ACT inhaler Inhale 2 puffs into the lungs every 6 (six) hours as needed. For shortness of breath and wheezing      . aspirin-acetaminophen-caffeine (EXCEDRIN MIGRAINE) 250-250-65 MG per tablet Take 2 tablets by mouth every 6 (six) hours as needed for pain.      . budesonide-formoterol (SYMBICORT) 160-4.5 MCG/ACT inhaler Inhale 2 puffs into the lungs 2 (two) times daily.      . pantoprazole (PROTONIX) 40 MG tablet Take 1 tablet (40 mg total) by mouth daily.  30 tablet  11   No current facility-administered medications for this visit.    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 She is married for 23 years. She has one child. There is no tobacco use. She does drink occasional white wine. She does not routinely exercise.  Family History  Problem Relation Age of Onset  .  Heart disease Father   . COPD Maternal Grandmother   . Cancer - Colon Maternal Grandfather   . Heart disease Paternal Grandfather     ROS is negative for fever chills or night sweats. She admits to a 40 pound weight gain since December of last year, and has gained 3 pounds since I saw her last month. She denies any significant change in activity. She does admit to occasional headaches. She denies any neck stiffness. There are no visual symptoms. She does have a history of mild asthma. She does admit to a cough which may be  secondary to reflux. She denies recent wheezing. She denies sputum changes. She denies presyncope or syncope. Her chest pain is left-sided and nonexertional. She denies GI issues; specifically, there is no reflux. She denies any cough. She denies genitourinary issues. She is perimenopausal. She denies any musculoskeletal issues. She denies any rashes.  She denies paresthesias or muscle weakness. She denies any bleeding. She denies any psychiatric or behavioral issues. She denies any significant sleep issues. Other comprehensive 12 point system review is negative.  PE BP 110/70  Pulse 72  Ht 5\' 11"  (1.803 m)  Wt 278 lb 11.2 oz (126.417 kg)  BMI 38.89 kg/m2  LMP 03/14/2011 General: Alert, oriented, no distress. Moderate obesity Skin: normal turgor, no rashes HEENT: Normocephalic, atraumatic. Pupils round and reactive; sclera anicteric; Fundi normal without hemorrhages or exudates. Nose without nasal septal hypertrophy Mouth/Parynx benign; Mallinpatti scale 3 Neck: No JVD, no carotid briuts Lungs: clear to ausculatation and percussion; no wheezing or rales Chest wall was without discomfort to palpation. Heart: RRR, s1 s2 normal 1/6 systolic murmur. No S3 gallop. Abdomen: soft, nontender; no hepatosplenomehaly, BS+; abdominal aorta nontender and not dilated by palpation. Pulses 2+ Extremities: Trivial ankle edema bilaterally. no clubbinbg cyanosis, Homan's sign negative  Neurologic: grossly nonfocal Psychologic: Normal affect and mood.   Epworth Sleepiness Scale score:  calculated at 3 arguing against daytime sleepiness at her last evaluation.  LABS:  BMET    Component Value Date/Time   NA 139 01/05/2013 0828   K 4.4 01/05/2013 0828   CL 104 01/05/2013 0828   CO2 27 01/05/2013 0828   GLUCOSE 90 01/05/2013 0828   BUN 18 01/05/2013 0828   CREATININE 0.69 01/05/2013 0828   CREATININE 0.75 12/17/2012 0845   CALCIUM 8.7 01/05/2013 0828   GFRNONAA >90 12/17/2012 0845   GFRAA >90  12/17/2012 0845     Hepatic Function Panel  No results found for this basename: prot,  albumin,  ast,  alt,  alkphos,  bilitot,  bilidir,  ibili     CBC    Component Value Date/Time   WBC 7.5 12/17/2012 0845   RBC 4.52 12/17/2012 0845   HGB 12.0 12/17/2012 0845   HCT 37.4 12/17/2012 0845   PLT 315 12/17/2012 0845   MCV 82.7 12/17/2012 0845   MCH 26.5 12/17/2012 0845   MCHC 32.1 12/17/2012 0845   RDW 14.5 12/17/2012 0845     BNP    Component Value Date/Time   PROBNP 22.8 12/17/2012 0845    Lipid Panel     Component Value Date/Time   TRIG 136 01/05/2013 0828   LDLCALC 112* 01/05/2013 0828     RADIOLOGY: Mm Digital Screening  01/13/2013   CLINICAL DATA:  Screening.  EXAM: DIGITAL SCREENING BILATERAL MAMMOGRAM WITH CAD  COMPARISON:  Previous exam(s).  ACR Breast Density Category b: There are scattered areas of fibroglandular density.  FINDINGS: There are no findings suspicious  for malignancy. Images were processed with CAD.  IMPRESSION: No mammographic evidence of malignancy. A result letter of this screening mammogram will be mailed directly to the patient.  RECOMMENDATION: Screening mammogram in one year. (Code:SM-B-01Y)  BI-RADS CATEGORY  1: Negative   Electronically Signed   By: Anselmo Pickler M.D.   On: 01/13/2013 15:58     ASSESSMENT AND PLAN: My impression is that Ms. Elyn Krogh is a 52 year old perimenopausal female who has gained over 40 pounds over the past year. Her nuclear scan showed normal perfusion without scar or ischemia. Her echo Doppler study showed normal systolic as well as diastolic function. She had very mild mitral annular calcification. Presently, she is without any chest wall tenderness which has improved. In the past she did have a musculoskeletal etiology to some of her symptoms. She also has noticed marked benefit with initiation of Protonix with resolution of prior GERD symptomatology. There is no cough. We did discuss the importance of weight loss and  increased exercise which she has not been doing. I presently giving her 6 months to see if she can improve both weight as well as her lipid assessment. At that time, she still has significant increased LDL particle number particularly in light of her family history I will initiate therapy. She does note some intermittent leg swelling. I have given her a prescription for HCTZ 12.5 mg to take on a PRN basis for lower some edema.  Lennette Bihari, MD, [facility] 01/29/2013 9:14 AM

## 2013-01-29 NOTE — Patient Instructions (Signed)
Your physician recommends that you schedule a follow-up appointment and blood work in 6 months. Your physician has recommended you make the following change in your medication: start fluid pill as needed for leg swelling.

## 2013-01-30 ENCOUNTER — Ambulatory Visit: Payer: BC Managed Care – PPO | Admitting: Cardiovascular Disease

## 2013-07-22 ENCOUNTER — Encounter: Payer: Self-pay | Admitting: *Deleted

## 2013-07-22 ENCOUNTER — Other Ambulatory Visit: Payer: Self-pay | Admitting: *Deleted

## 2013-07-22 ENCOUNTER — Telehealth: Payer: Self-pay | Admitting: *Deleted

## 2013-07-22 DIAGNOSIS — Z8249 Family history of ischemic heart disease and other diseases of the circulatory system: Secondary | ICD-10-CM

## 2013-07-22 NOTE — Telephone Encounter (Signed)
Mailed letter and orders to get lab work.

## 2014-10-20 ENCOUNTER — Ambulatory Visit (INDEPENDENT_AMBULATORY_CARE_PROVIDER_SITE_OTHER): Payer: PRIVATE HEALTH INSURANCE

## 2014-10-20 ENCOUNTER — Ambulatory Visit (INDEPENDENT_AMBULATORY_CARE_PROVIDER_SITE_OTHER): Payer: PRIVATE HEALTH INSURANCE | Admitting: Emergency Medicine

## 2014-10-20 VITALS — BP 102/67 | HR 60 | Temp 97.8°F | Resp 20 | Ht 71.0 in | Wt 271.2 lb

## 2014-10-20 DIAGNOSIS — M25562 Pain in left knee: Secondary | ICD-10-CM | POA: Diagnosis not present

## 2014-10-20 MED ORDER — NAPROXEN SODIUM 550 MG PO TABS: 550.0000 mg | ORAL_TABLET | Freq: Two times a day (BID) | ORAL | Status: DC

## 2014-10-20 NOTE — Patient Instructions (Signed)

## 2014-10-20 NOTE — Progress Notes (Signed)
Subjective:  Patient ID: Melinda Reeves, female    DOB: Jul 27, 1960  Age: 54 y.o. MRN: [id number]  CC: Knee Pain   HPI Melinda Reeves presents  with an injury to her left knee. She attempted to sit on a ski lift and her left foot caught and was hyper plantar flexed. Since that time on when she was injured on Thursday she's had increasing pain in her posterior knee associated with some effusion that increases during the course of today that diminishes at night. She has pain with ambulation and standing. Any ecchymosis or deformity has no instability of knee joint. No improvement with over-the-counter medication  History Melinda Reeves has a past medical history of Asthma; Allergy; and Anemia.   She has past surgical history that includes Cesarean section and Breast surgery.   Her  family history includes Breast cancer in her maternal grandmother, mother, and paternal grandmother; COPD in her maternal grandmother; Cancer - Colon in her maternal grandfather, maternal grandmother, and mother; Diabetes in her father; Heart disease in her father, paternal grandfather, and paternal grandmother; Hyperlipidemia in her father; Hypertension in her father and paternal grandmother; Uterine cancer in her mother.  She   reports that she has never smoked. She has never used smokeless tobacco. She reports that she drinks about 0.5 oz of alcohol per week. She reports that she does not use illicit drugs.  Outpatient Prescriptions Prior to Visit  Medication Sig Dispense Refill  . budesonide-formoterol (SYMBICORT) 160-4.5 MCG/ACT inhaler Inhale 2 puffs into the lungs 2 (two) times daily.    Marland Kitchen albuterol (PROVENTIL HFA;VENTOLIN HFA) 108 (90 BASE) MCG/ACT inhaler Inhale 2 puffs into the lungs every 6 (six) hours as needed. For shortness of breath and wheezing    . acetaminophen (TYLENOL) 500 MG tablet Take 1,000 mg by mouth every 6 (six) hours as needed for pain (headache).    Marland Kitchen aspirin-acetaminophen-caffeine (EXCEDRIN  MIGRAINE) 250-250-65 MG per tablet Take 2 tablets by mouth every 6 (six) hours as needed for pain.    . hydrochlorothiazide (MICROZIDE) 12.5 MG capsule Take as needed for swelling 30 capsule 3  . pantoprazole (PROTONIX) 40 MG tablet Take 1 tablet (40 mg total) by mouth daily. 30 tablet 11   No facility-administered medications prior to visit.    History   Social History  . Marital Status: Married    Spouse Name: N/A  . Number of Children: N/A  . Years of Education: N/A   Social History Main Topics  . Smoking status: Never Smoker   . Smokeless tobacco: Never Used  . Alcohol Use: 0.5 oz/week    1 Standard drinks or equivalent per week  . Drug Use: No  . Sexual Activity: Not on file   Other Topics Concern  . None   Social History Narrative     Review of Systems  Constitutional: Negative for fever, chills and appetite change.  HENT: Negative for congestion, ear pain, postnasal drip, sinus pressure and sore throat.   Eyes: Negative for pain and redness.  Respiratory: Negative for cough, shortness of breath and wheezing.   Cardiovascular: Negative for leg swelling.  Gastrointestinal: Negative for nausea, vomiting, abdominal pain, diarrhea, constipation and blood in stool.  Endocrine: Negative for polyuria.  Genitourinary: Negative for dysuria, urgency, frequency and flank pain.  Musculoskeletal: Negative for gait problem.  Skin: Negative for rash.  Neurological: Negative for weakness and headaches.  Psychiatric/Behavioral: Negative for confusion and decreased concentration. The patient is not nervous/anxious.     Objective:  BP 102/67 mmHg  Pulse 60  Temp(Src) 97.8 F (36.6 C) (Oral)  Resp 20  Ht  (1.803 m)  Wt 271 lb 4 oz (123.038 kg)  BMI 37.85 kg/m2  SpO2 98%  LMP 03/14/2011  Physical Exam  Constitutional: She is oriented to person, place, and time. She appears well-developed and well-nourished.  HENT:  Head: Normocephalic and atraumatic.  Eyes:  Conjunctivae are normal. Pupils are equal, round, and reactive to light.  Pulmonary/Chest: Effort normal.  Musculoskeletal: She exhibits no edema.       Left knee: She exhibits swelling. She exhibits no ecchymosis. Tenderness found.  Neurological: She is alert and oriented to person, place, and time.  Skin: Skin is dry.  Psychiatric: She has a normal mood and affect. Her behavior is normal. Thought content normal.       Assessment & Plan:   Melinda Reeves was seen today for knee pain.  Diagnoses and all orders for this visit:  Left knee pain Orders: -     DG Knee Complete 4 Views Left; Future  Other orders -     naproxen sodium (ANAPROX DS) 550 MG tablet; Take 1 tablet (550 mg total) by mouth 2 (two) times daily with a meal.   I have discontinued Melinda Reeves's aspirin-acetaminophen-caffeine, acetaminophen, pantoprazole, and hydrochlorothiazide. I am also having her start on naproxen sodium. Additionally, I am having her maintain her budesonide-formoterol and albuterol.  Meds ordered this encounter  Medications  . albuterol (PROAIR HFA) 108 (90 BASE) MCG/ACT inhaler    Sig: Inhale 1-2 puffs into the lungs every 6 (six) hours as needed for wheezing or shortness of breath.  . naproxen sodium (ANAPROX DS) 550 MG tablet    Sig: Take 1 tablet (550 mg total) by mouth 2 (two) times daily with a meal.    Dispense:  40 tablet    Refill:  0    Appropriate red flag conditions were discussed with the patient as well as actions that should be taken.  Patient expressed his understanding.  Follow-up: Return in about 1 week (around 10/27/2014), or if symptoms worsen or fail to improve.  Carmelina Dane, MD   [facility] reading (PRIMARY) by  Dr. Dareen Piano negative knee.

## 2014-10-24 IMAGING — CR DG CHEST 2V
2 series · 2 of 2 positions shown · non-contrast
Comparison: 04/11/2009

CLINICAL DATA: Chest pain

EXAM:
CHEST  2 VIEW

[w chest pa]
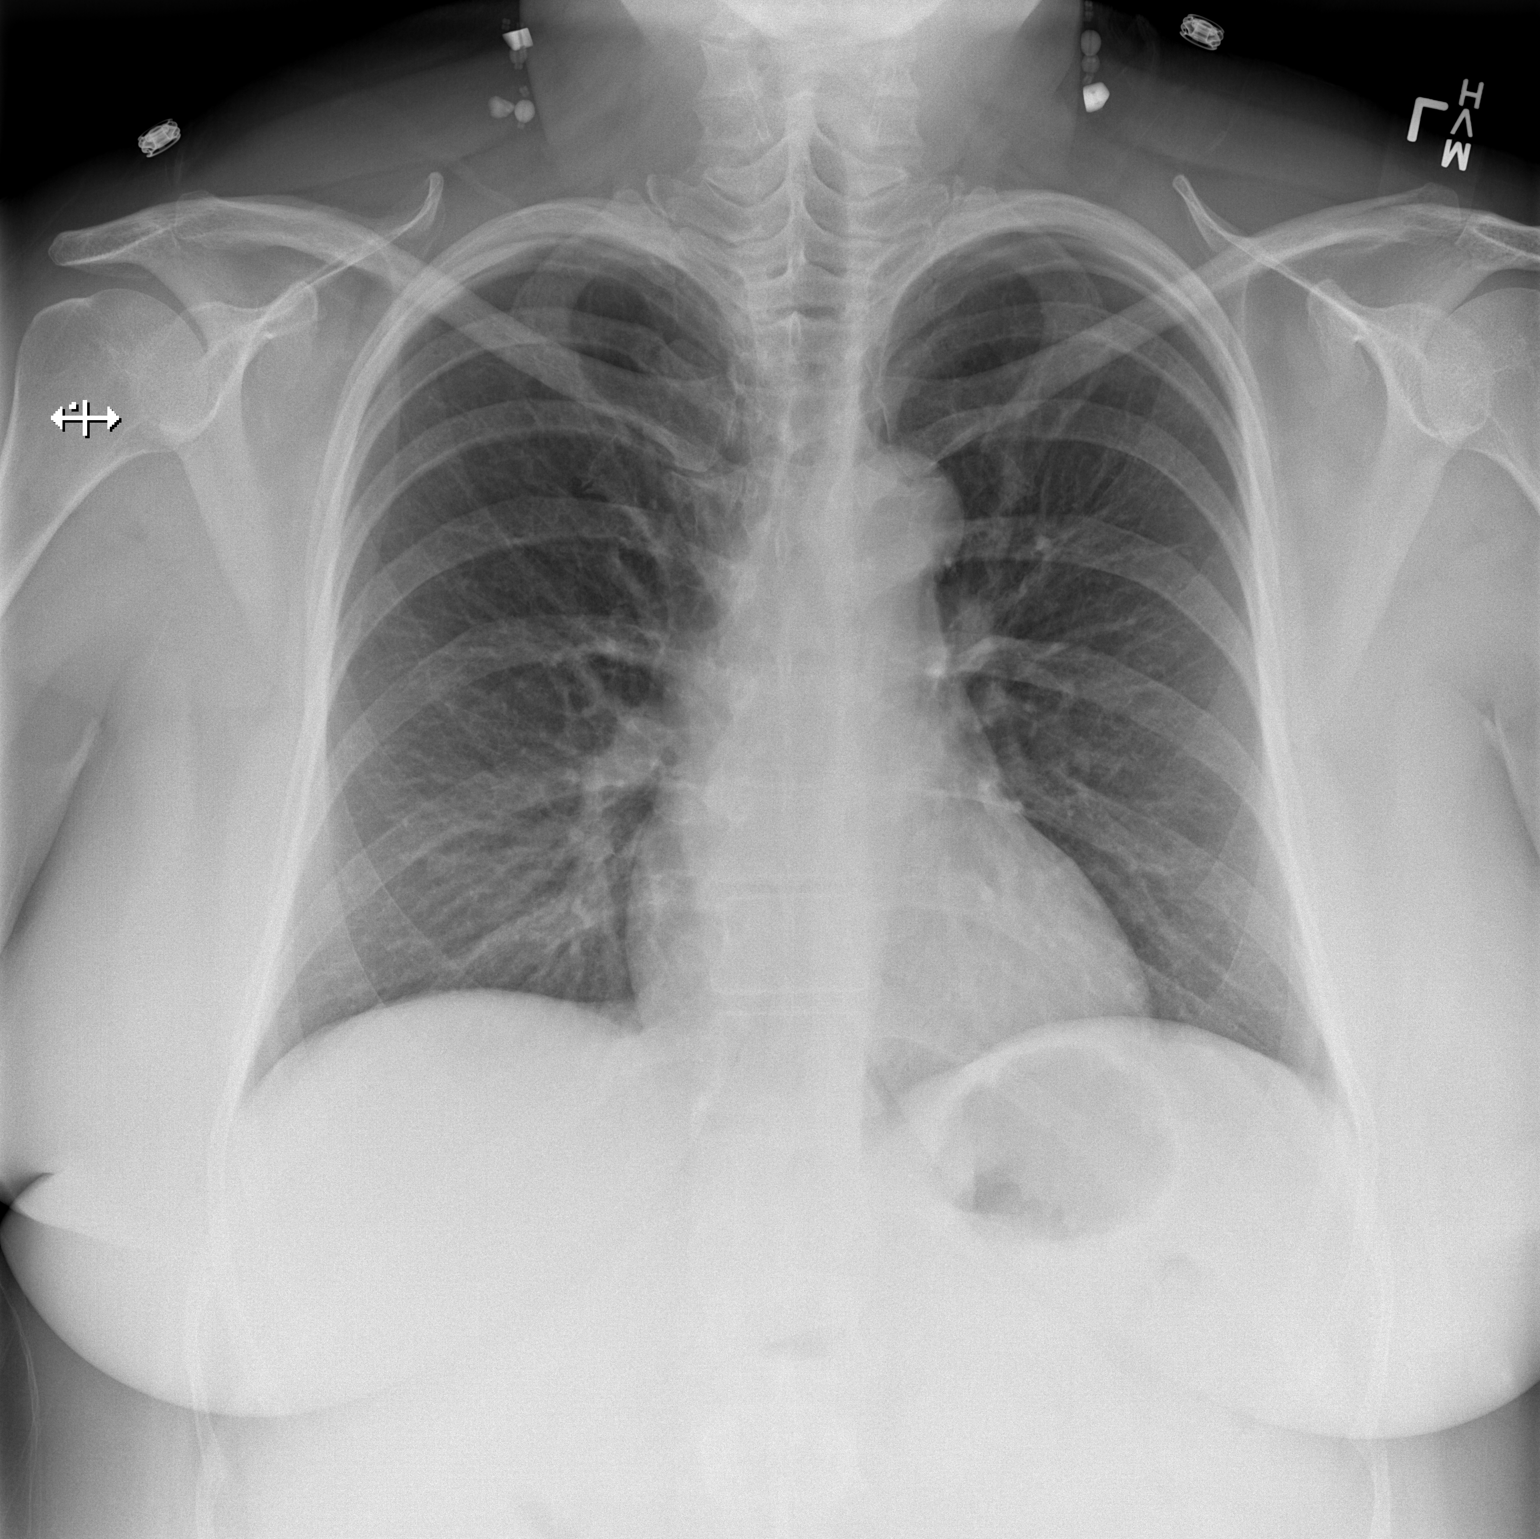

[w chest lat]
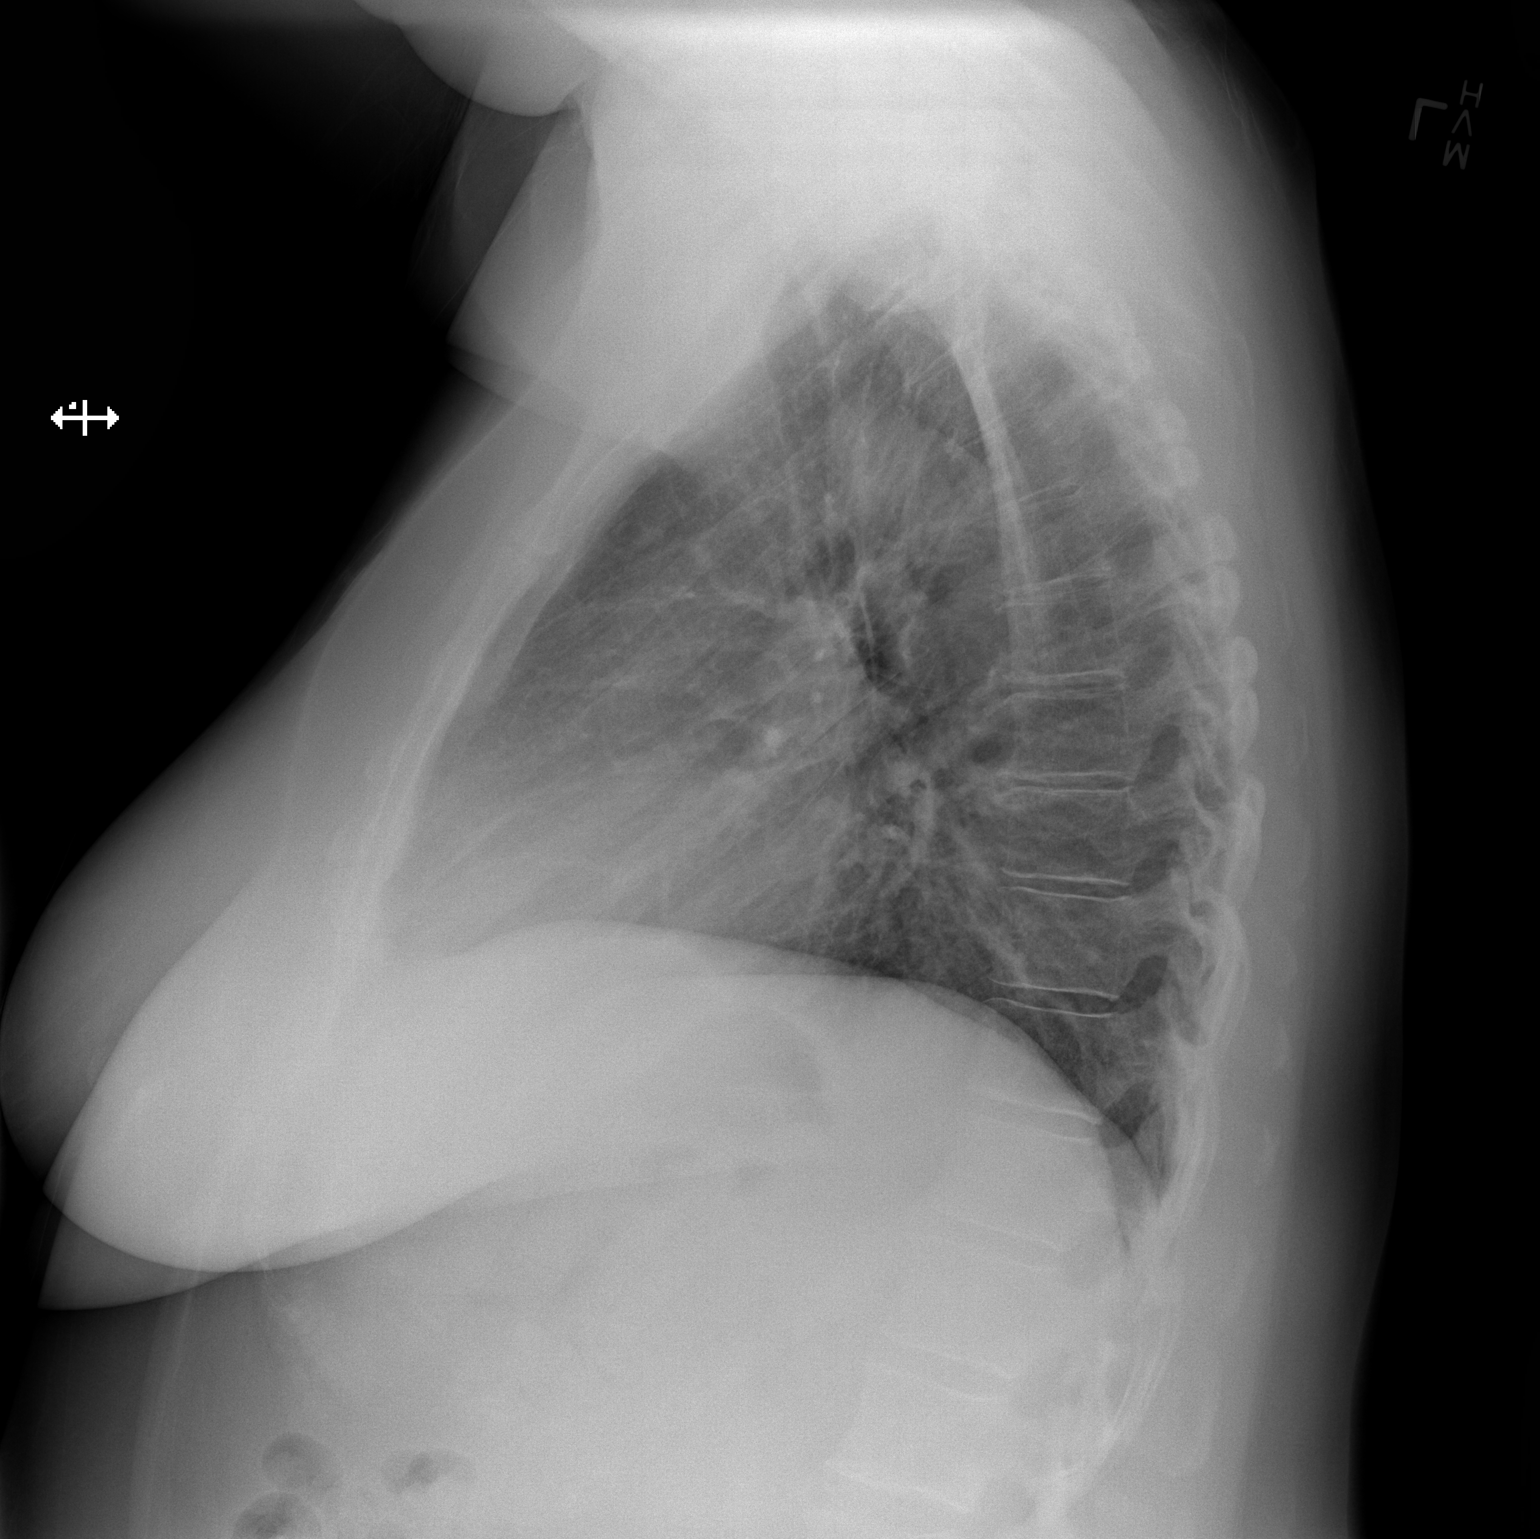

[2 of 2 positions shown; findings below may reference images not displayed]

FINDINGS: Lungs are clear. No pleural effusion or pneumothorax.

The heart is normal in size.

Visualized osseous structures are within normal limits.
IMPRESSION: No evidence of acute cardiopulmonary disease.

## 2014-10-24 IMAGING — CT CT ANGIO CHEST
1 of 2 series · 20 of 32 positions shown · IV contrast (OMNIPAQUE 350)
Comparison: Chest x-ray dated 12/17/2012

CLINICAL DATA: Shortness of breath, cough, and left leg pain.

EXAM:
CT ANGIOGRAPHY CHEST WITH CONTRAST
TECHNIQUE: Multidetector CT imaging of the chest was performed using the
standard protocol during bolus administration of intravenous
contrast. Multiplanar CT image reconstructions including MIPs were
obtained to evaluate the vascular anatomy.
CONTRAST:  100mL OMNIPAQUE IOHEXOL 350 MG/ML SOLN

[Series 6: thins for pacs · axial · 0.66mm/px · z∈[-233,-15]mm · 20 of 244 slices shown]
[im 13/244  lung]
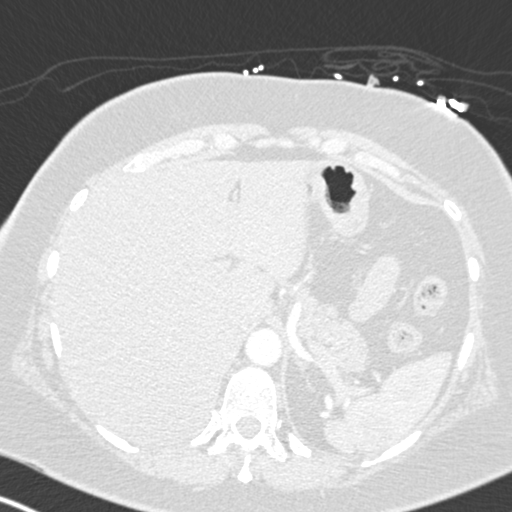
[im 25/244  mediastinal]
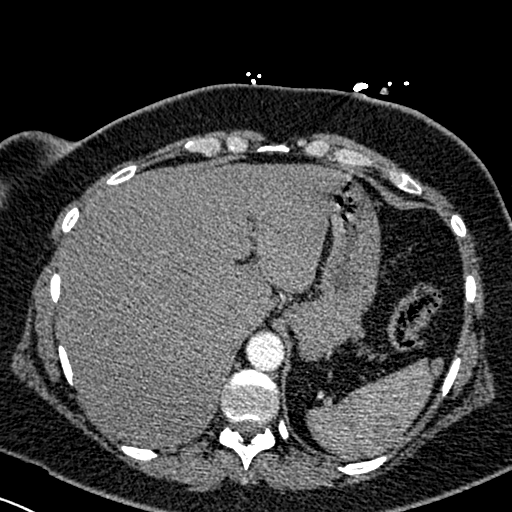
[im 37/244  lung]
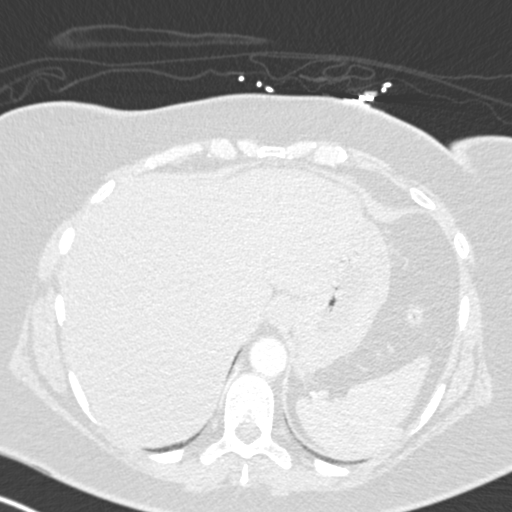
[im 49/244  mediastinal]
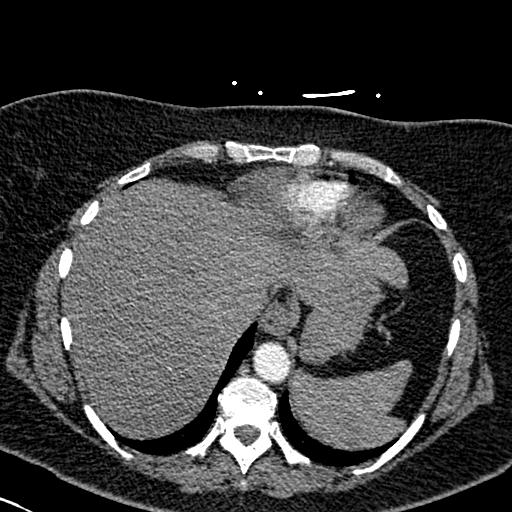
[im 61/244  lung]
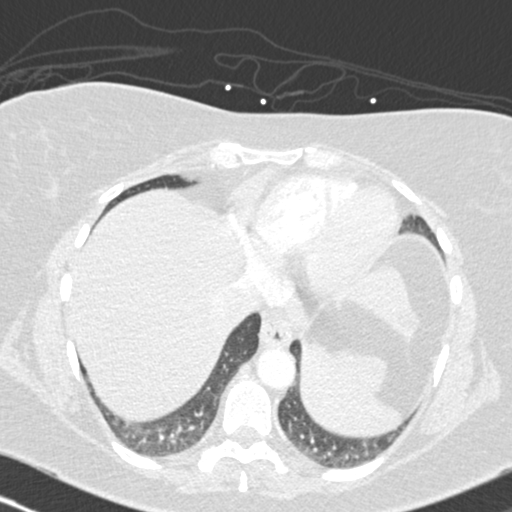
[im 82/244  mediastinal]
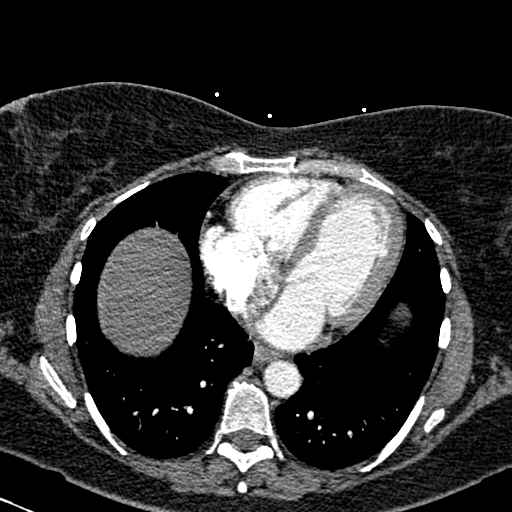
[im 86/244  lung]
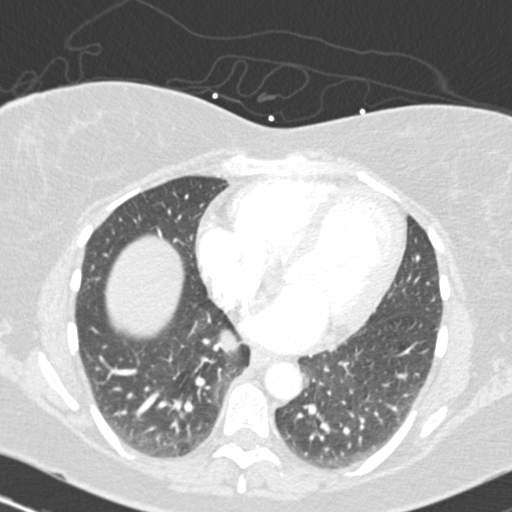
[im 98/244  mediastinal]
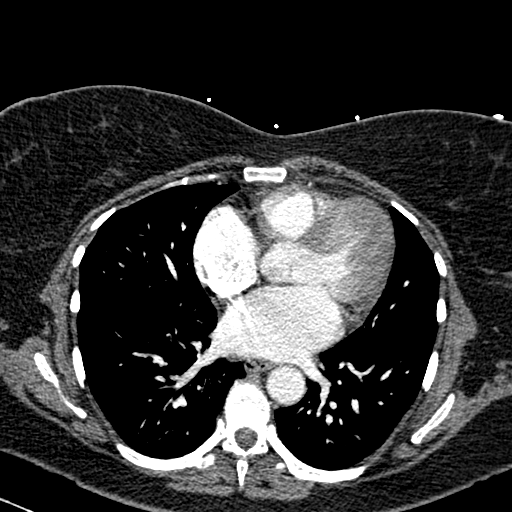
[im 110/244  lung]
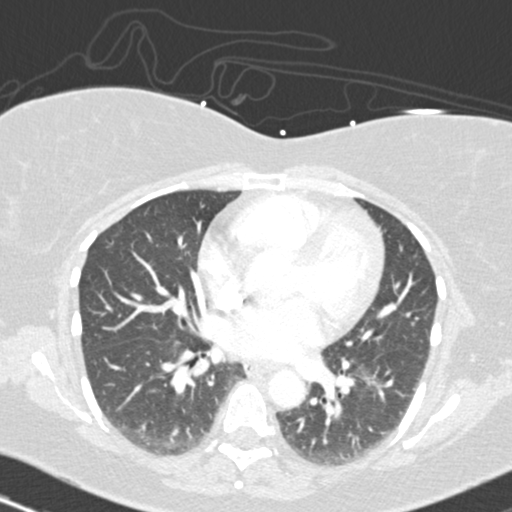
[im 113/244  mediastinal]
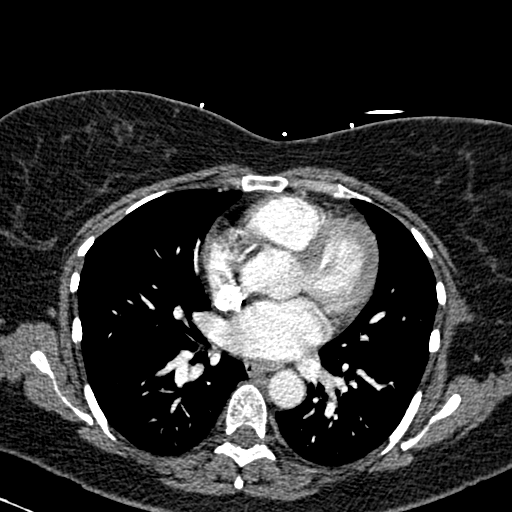
[im 122/244  lung]
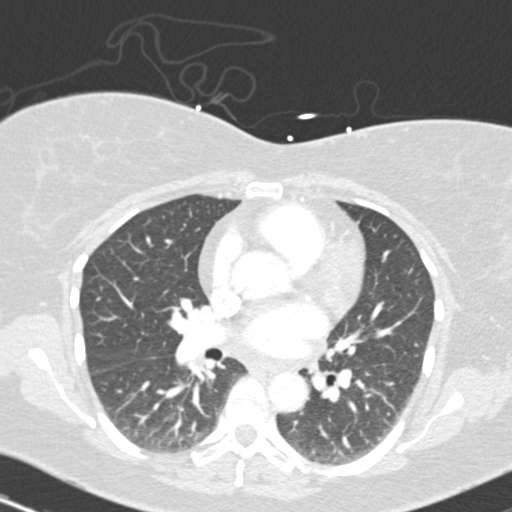
[im 134/244  mediastinal]
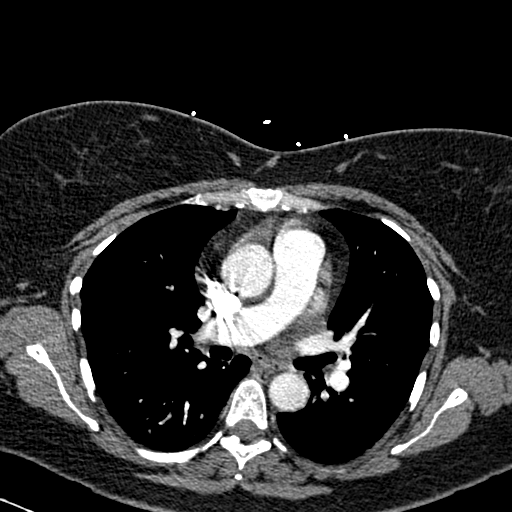
[im 146/244  lung]
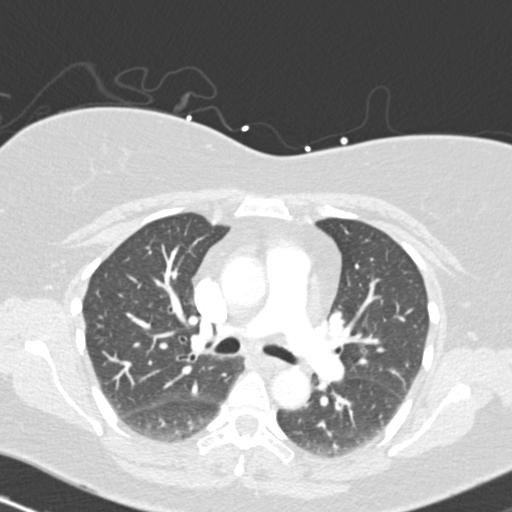
[im 158/244  mediastinal]
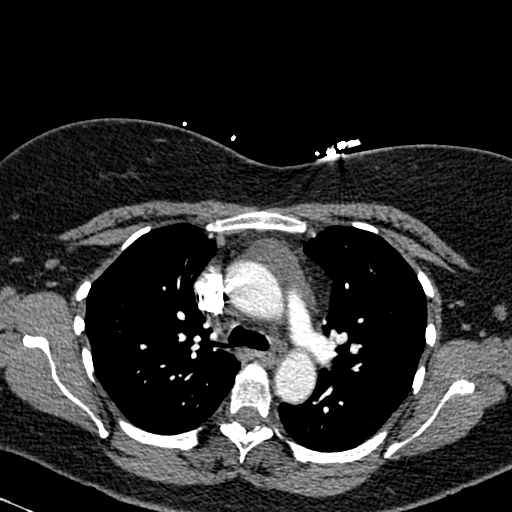
[im 163/244  lung]
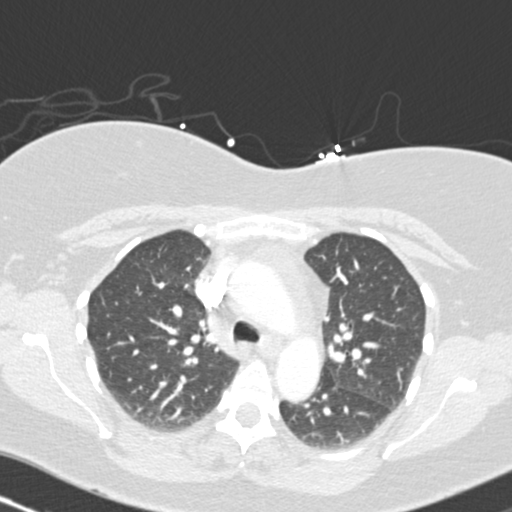
[im 183/244  mediastinal]
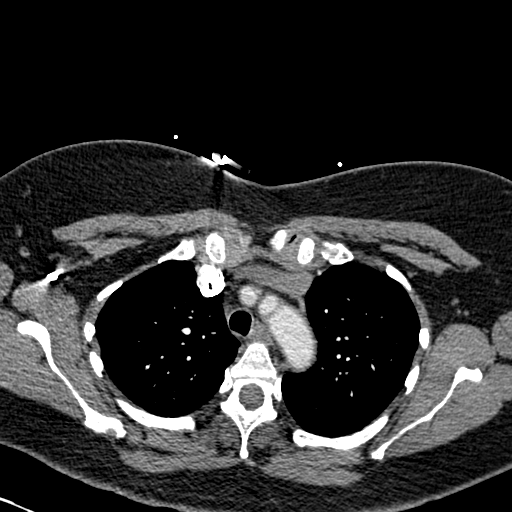
[im 195/244  lung]
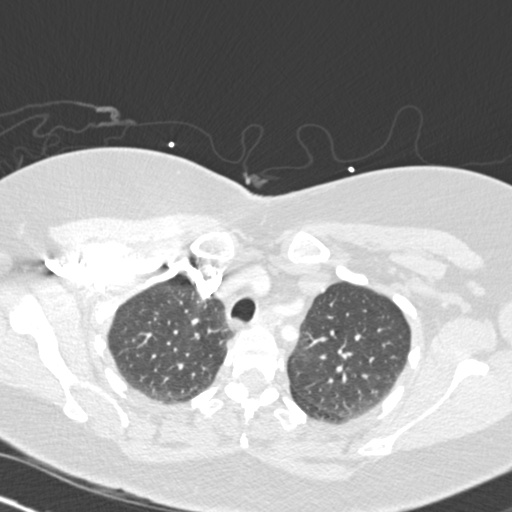
[im 207/244  mediastinal]
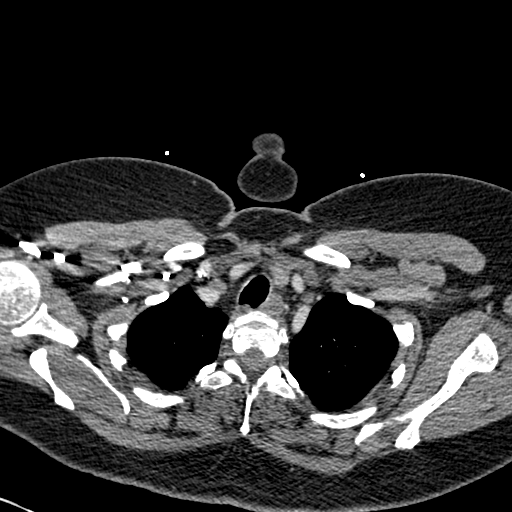
[im 219/244  lung]
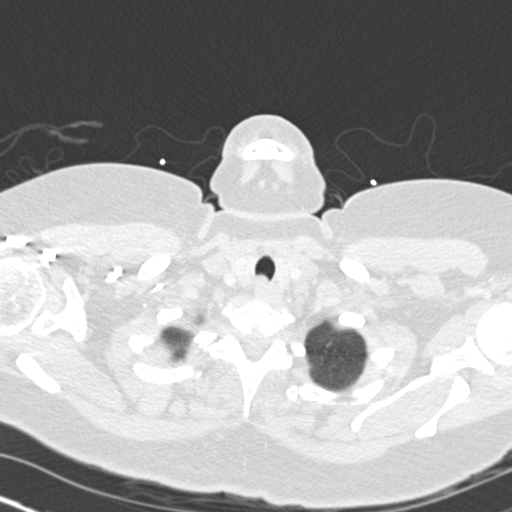
[im 231/244  mediastinal]
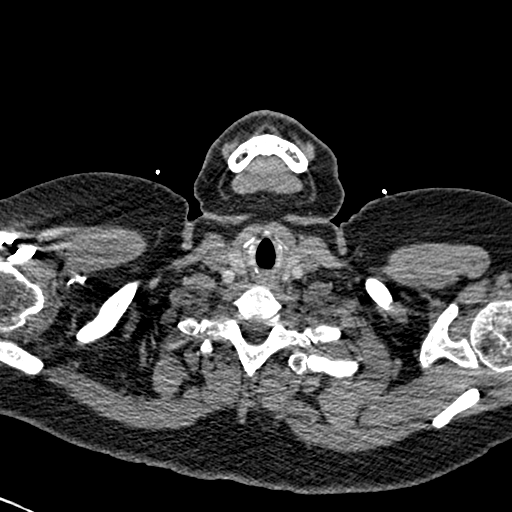

[20 of 32 positions shown; findings below may reference images not displayed]

FINDINGS: There are no pulmonary emboli, infiltrates, or effusions. Heart size
is within normal limits. No osseous abnormality. There is a small
amount of pericardial fluid around the root of the main pulmonary
artery and thoracic aorta. No adenopathy. The visualized portion of
the upper abdomen is normal.

Review of the MIP images confirms the above findings.
IMPRESSION: No pulmonary emboli or other significant abnormalities.

## 2015-05-31 ENCOUNTER — Telehealth: Payer: Self-pay | Admitting: Cardiovascular Disease

## 2015-05-31 NOTE — Telephone Encounter (Signed)
New Message  Request for surgical clearance:  What type of surgery is being performed? Arthroscopic surgery on her knee for a meniscus tear repair    1. When is this surgery scheduled? Not scheduled yet until the surgery is cleared from PCP and [facility]   2. Are there any medications that need to be held prior to surgery and how long? Pt states that she doesn't take any medication  3. Name of physician performing surgery? Dr. Winona [facility]   4. What is your office phone and fax number? Phone # [phone] Fax: ???

## 2015-05-31 NOTE — Telephone Encounter (Signed)
Patient called and scheduled for MD OV 06/06/2015 @ 2pm

## 2015-05-31 NOTE — Telephone Encounter (Signed)
LM for patient that she will need an appointment with Dr. Tresa EndoKelly as her last office visit was 01/2013.   Informed her will have a scheduler contact her to arrange appointment.

## 2015-06-06 ENCOUNTER — Ambulatory Visit (INDEPENDENT_AMBULATORY_CARE_PROVIDER_SITE_OTHER): Payer: 59 | Admitting: Cardiovascular Disease

## 2015-06-06 VITALS — BP 122/76 | HR 53 | Ht 72.0 in | Wt 266.0 lb

## 2015-06-06 DIAGNOSIS — Z01818 Encounter for other preprocedural examination: Secondary | ICD-10-CM

## 2015-06-06 DIAGNOSIS — Z8249 Family history of ischemic heart disease and other diseases of the circulatory system: Secondary | ICD-10-CM

## 2015-06-06 DIAGNOSIS — K219 Gastro-esophageal reflux disease without esophagitis: Secondary | ICD-10-CM | POA: Diagnosis not present

## 2015-06-06 DIAGNOSIS — E785 Hyperlipidemia, unspecified: Secondary | ICD-10-CM | POA: Diagnosis not present

## 2015-06-06 DIAGNOSIS — E669 Obesity, unspecified: Secondary | ICD-10-CM | POA: Diagnosis not present

## 2015-06-06 NOTE — Patient Instructions (Signed)
Please have labs - done - fasting state- nothing to eat or drink the morning of the test   You have  Clearance for your upcoming surgery with Dr Linna CapriceSWINTECK  Your physician recommends that you schedule a follow-up appointment on an as needed basis.  We let you know about lab results.

## 2015-06-07 ENCOUNTER — Encounter: Payer: Self-pay | Admitting: Cardiovascular Disease

## 2015-06-07 DIAGNOSIS — Z01818 Encounter for other preprocedural examination: Secondary | ICD-10-CM | POA: Insufficient documentation

## 2015-06-07 NOTE — Progress Notes (Signed)
Patient ID: Melinda Reeves, female   DOB: 1961-02-01, 55 y.o.   MRN: [id number]     Primary MD: Dr. Noberto Retort Referring M.D.: Dr. Linna Caprice  HPI:  Melinda Reeves is a 55 year-old female who presents for preoperative clearance prior to undergoing left knee arthroscopic surgery by Dr. Venancio Poisson.  I had seen her in 2014 for evaluation of left-sided chest pain as well as occasional episodes of palpitations. Her primary physician is Dr. Leonides Sake. I have been taking care of her father for many years.  Melinda Reeves denies any known cardiac history.  She has a very strong family history for premature coronary artery disease in her father who is status post CABG revascularization surgery and has a documented abdominal aortic aneurysm. Melinda Reeves is perimenopausal with her last period being in December 2013.  When I saw her in 2014, she had experienced episodes of left-sided chest pain associated with mild episodes of shortness of breath. She has had decreased energy. She noted occasional palpitations for several weeks which typically lasts 15 seconds and self abate. She has  a history of GERD and complained of some mild headaches associated with her chest pain. She does note some mild cough. With her  chest pain symptoms she had presented to the  emergency room on 12/17/2012 and was evaluated by Dr. Benjiman Core. Her ECG was unremarkable. A CT angiogram was done due to her right-sided pain and she was not felt to have a pulmonary embolism. Cardiac enzymes were negative x2.   When I saw her, I recommended that she undergo a 2-D echo Doppler study as well as a coronal perfusion study.  The nuclear study was done in 01/07/2013 and revealed normal perfusion and function, without evidence for scar or ischemia.  Post stress ejection fraction was 69%.  She walked for 6 minutes and 45 seconds on a treadmill and did not develop chest pain or ECG changes.  She underwent an echo Doppler study in 01/19/2013 which showed an  ejection fraction at 55-65%.  There was mild mitral annular calcification with trivial mitral regurgitation.  She had normal diastolic function.  Recently, Melinda Reeves is asymptomatic from a cardiac standpoint.  She does have a history of mild asthma for which she takes Symbicort daily and when necessary albuterol.  She has been taking meloxicam for her left knee pain and will need to undergo arthroscopy.  She presents for preoperative clearance.  She states that she has not had recent laboratory.  She specifically denies chest pain, palpitations, change in exercise tolerance, exertional dyspnea, PND, orthopnea.  Past Medical History  Diagnosis Date  . Asthma   . Allergy   . Anemia     Past Surgical History  Procedure Laterality Date  . Cesarean section    . Breast surgery      Allergies  Allergen Reactions  . Latex     Current Outpatient Prescriptions  Medication Sig Dispense Refill  . albuterol (PROAIR HFA) 108 (90 BASE) MCG/ACT inhaler Inhale 1-2 puffs into the lungs every 6 (six) hours as needed for wheezing or shortness of breath.    . budesonide-formoterol (SYMBICORT) 160-4.5 MCG/ACT inhaler Inhale 2 puffs into the lungs 2 (two) times daily.    . meloxicam (MOBIC) 15 MG tablet Take 1 tablet by mouth as needed.  3   No current facility-administered medications for this visit.    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  She completed 2 years of college. She is married for 25 years. She has one child. There is no tobacco use. She does drink occasional white wine. She does not routinely exercise.  Family History  Problem Relation Age of Onset  . Heart disease Father   . Diabetes Father   . Hyperlipidemia Father   . Hypertension Father   . COPD Maternal Grandmother   . Breast cancer Maternal Grandmother   . Cancer - Colon Maternal Grandmother   . Cancer - Colon Maternal Grandfather   . Heart disease Paternal Grandfather     . Cancer - Colon Mother   . Breast cancer Mother   . Uterine cancer Mother   . Breast cancer Paternal Grandmother   . Heart disease Paternal Grandmother   . Hypertension Paternal Grandmother    ROS General: Negative; No fevers, chills, or night sweats; positive for recent weight gain and in January 2017 her weight had increased to 285 pounds, but she has subsequently lost down to 266 pounds HEENT: Negative; No changes in vision or hearing, sinus congestion, difficulty swallowing Pulmonary: History of mild asthma Cardiovascular: Negative; No chest pain, presyncope, syncope, palpitations GI: Positive for GERD. GU: Negative; No dysuria, hematuria, or difficulty voiding Musculoskeletal: Positive for left knee discomfort Hematologic/Oncology: Negative; no easy bruising, bleeding Endocrine: Negative; no heat/cold intolerance; no diabetes Neuro: Negative; no changes in balance, headaches Skin: Negative; No rashes or skin lesions Psychiatric: Negative; No behavioral problems, depression Sleep: Negative; No snoring, daytime sleepiness, hypersomnolence, bruxism, restless legs, hypnogognic hallucinations, no cataplexy Other comprehensive 14 point system review is negative.   PE BP 122/76 mmHg  Pulse 53  Ht 6' (1.829 m)  Wt 266 lb (120.657 kg)  BMI 36.07 kg/m2  LMP 03/14/2011   Wt Readings from Last 3 Encounters:  06/06/15 266 lb (120.657 kg)  10/20/14 271 lb 4 oz (123.038 kg)  01/29/13 278 lb 11.2 oz (126.417 kg)   General: Alert, oriented, no distress. Moderate obesity Skin: normal turgor, no rashes HEENT: Normocephalic, atraumatic. Pupils round and reactive; sclera anicteric; Fundi normal without hemorrhages or exudates. Nose without nasal septal hypertrophy Mouth/Parynx benign; Mallinpatti scale 3 Neck: No JVD, no carotid bruits Lungs: clear to ausculatation and percussion; no wheezing or rales Chest wall with definite left costochondral tenderness to palpation suggestive of  musculoskeletal chest pain. Heart: RRR, s1 s2 normal 1/6 systolic murmur. No S3 gallop.  No diastolic murmur.  No rubs thrills or heaves. Abdomen: soft, nontender; no hepatosplenomehaly, BS+; abdominal aorta nontender and not dilated by palpation. Pulses 2+ Extremities: no clubbinbg cyanosis or edema, Homan's sign negative  Neurologic: grossly nonfocal Psychologic: Normal affect and mood.  ECG (independently read by me): Sinus bradycardia 53 bpm.  Mild sinus arrhythmia.  Normal intervals.  No ST segment changes.  October 2014 ECG: Normal sinus rhythm at 76. Normal intervals. QTc interval 445. No significant ST changes.    LABS: BMP Latest Ref Rng 01/05/2013 12/17/2012  Glucose 70 - 99 mg/dL 90 161(W)  BUN 6 - 23 mg/dL 18 14  Creatinine 9.60 - 1.10 mg/dL 4.54 0.98  Sodium 119 - 145 mEq/L 139 137  Potassium 3.5 - 5.3 mEq/L 4.4 3.6  Chloride 96 - 112 mEq/L 104 100  CO2 19 - 32 mEq/L 27 27  Calcium 8.4 - 10.5 mg/dL 8.7 9.8   No flowsheet data found. CBC Latest Ref Rng 12/17/2012  WBC 4.0 - 10.5 K/uL 7.5  Hemoglobin 12.0 - 15.0 g/dL 14.7  Hematocrit 82.9 - 46.0 %  37.4  Platelets 150 - 400 K/uL 315   Lab Results  Component Value Date   MCV 82.7 12/17/2012   Lab Results  Component Value Date   TSH 4.754* 01/05/2013   No results found for: HGBA1C   Lipid Panel     Component Value Date/Time   CHOL 188 01/05/2013 0828   TRIG 136 01/05/2013 0828   HDL 49 01/05/2013 0828   LDLCALC 112* 01/05/2013 0828     RADIOLOGY: Mm Digital Screening  01/13/2013   CLINICAL DATA:  Screening.  EXAM: DIGITAL SCREENING BILATERAL MAMMOGRAM WITH CAD  COMPARISON:  Previous exam(s).  ACR Breast Density Category b: There are scattered areas of fibroglandular density.  FINDINGS: There are no findings suspicious for malignancy. Images were processed with CAD.  IMPRESSION: No mammographic evidence of malignancy. A result letter of this screening mammogram will be mailed directly to the patient.   RECOMMENDATION: Screening mammogram in one year. (Code:SM-B-01Y)  BI-RADS CATEGORY  1: Negative   Electronically Signed   By: Anselmo Picklerandy  Jackson M.D.   On: 01/13/2013 15:58     ASSESSMENT AND PLAN: Ms. Melinda Reeves is a 55 year old female who has a history of obesity and over the years has fluctuated with weight gain.  In 2014, she was evaluated in the emergency room with chest pain which most likely was of musculoskeletal etiology.  A chest CT was negative.  A subsequent echo Doppler study revealed normal systolic and diastolic function.  A nuclear perfusion study was normal.  Subsequent, she has continued to do well.  Her weight has again increased and in January she weighed up to 285 pounds.  She has subtotally lost almost 20 pounds over the past 2 months.  Her body mass index today is 36.07 kg/m consistent with moderate obesity.  I commended her on her weight loss and encouraged additional weight loss to get her under the "obesity "threshold.  She has had issues with her left knee and has been felt to have a meniscus tear and will undergo arthroscopy by Dr. Dillard CannonSwininteck.  As result of her knee discomfort she has not been able to exercise as she had in the past.  Presently, she denies any chest pain or shortness of breath.  She does experience occasional GERD for which she takes Tums.  She has not had recent laboratory.  I am recommending a complete set of blood work be obtained in the fasting state.  I will contact her regarding the results.  She has a history of asthma.  There is no wheezing on exam.  She continues to be on Symbicort inhaler daily.  She also is to see Dr. Leonides Sakeandy Harris for a primary care evaluation.  From a cardiac standpoint, I do not feel further nuclear testing or studies are warranted presently.  I will give her clearance to undergo planned arthroscopy surgery by Dr. Dillard CannonSwininteck.  As long as she remains stable, I will see her on an as-needed basis or if problems arise.  Lennette Biharihomas A. Kelly, MD,  [facility] 06/07/2015 7:49 AM

## 2015-06-20 ENCOUNTER — Ambulatory Visit: Payer: Self-pay | Admitting: Orthopedic Surgery

## 2015-06-24 ENCOUNTER — Encounter (HOSPITAL_BASED_OUTPATIENT_CLINIC_OR_DEPARTMENT_OTHER): Payer: Self-pay | Admitting: *Deleted

## 2015-06-27 ENCOUNTER — Ambulatory Visit: Payer: Self-pay | Admitting: Orthopedic Surgery

## 2015-06-27 ENCOUNTER — Encounter (HOSPITAL_BASED_OUTPATIENT_CLINIC_OR_DEPARTMENT_OTHER): Payer: Self-pay | Admitting: *Deleted

## 2015-06-27 NOTE — Progress Notes (Signed)
NPO AFTER MN.  ARRIVE AT 0600.  NEEDS HG.  CURRENT EKG IN CHART AND EPIC.  WILL TAKE REFLUX MEDICATION AND TYLENOL IF NEEDED AM DOS W/ SIPS OF WATER.

## 2015-06-30 ENCOUNTER — Encounter (HOSPITAL_BASED_OUTPATIENT_CLINIC_OR_DEPARTMENT_OTHER): Payer: Self-pay | Admitting: *Deleted

## 2015-06-30 ENCOUNTER — Encounter (HOSPITAL_BASED_OUTPATIENT_CLINIC_OR_DEPARTMENT_OTHER)
Admission: RE | Disposition: A | Discharge status: Home / Self Care | Payer: Self-pay | Source: Ambulatory Visit | Attending: Orthopedic Surgery

## 2015-06-30 ENCOUNTER — Ambulatory Visit (HOSPITAL_BASED_OUTPATIENT_CLINIC_OR_DEPARTMENT_OTHER): Payer: 59 | Admitting: Anesthesiology

## 2015-06-30 ENCOUNTER — Ambulatory Visit (HOSPITAL_BASED_OUTPATIENT_CLINIC_OR_DEPARTMENT_OTHER)
Admission: RE | Admit: 2015-06-30 | Discharge: 2015-06-30 | Disposition: A | Discharge status: Home / Self Care | Payer: 59 | Source: Ambulatory Visit | Attending: Orthopedic Surgery | Admitting: Orthopedic Surgery

## 2015-06-30 DIAGNOSIS — Z6835 Body mass index (BMI) 35.0-35.9, adult: Secondary | ICD-10-CM | POA: Insufficient documentation

## 2015-06-30 DIAGNOSIS — K219 Gastro-esophageal reflux disease without esophagitis: Secondary | ICD-10-CM | POA: Diagnosis not present

## 2015-06-30 DIAGNOSIS — M23301 Other meniscus derangements, unspecified lateral meniscus, left knee: Secondary | ICD-10-CM | POA: Diagnosis present

## 2015-06-30 DIAGNOSIS — M23201 Derangement of unspecified lateral meniscus due to old tear or injury, left knee: Secondary | ICD-10-CM | POA: Insufficient documentation

## 2015-06-30 DIAGNOSIS — M1712 Unilateral primary osteoarthritis, left knee: Secondary | ICD-10-CM | POA: Diagnosis present

## 2015-06-30 DIAGNOSIS — Z791 Long term (current) use of non-steroidal anti-inflammatories (NSAID): Secondary | ICD-10-CM | POA: Insufficient documentation

## 2015-06-30 DIAGNOSIS — Z87891 Personal history of nicotine dependence: Secondary | ICD-10-CM | POA: Insufficient documentation

## 2015-06-30 DIAGNOSIS — Z79899 Other long term (current) drug therapy: Secondary | ICD-10-CM | POA: Diagnosis not present

## 2015-06-30 HISTORY — DX: Gastro-esophageal reflux disease without esophagitis: K21.9

## 2015-06-30 HISTORY — DX: Stress incontinence (female) (male): N39.3

## 2015-06-30 HISTORY — DX: Hyperlipidemia, unspecified: E78.5

## 2015-06-30 HISTORY — DX: Unspecified tear of unspecified meniscus, current injury, left knee, initial encounter: S83.207A

## 2015-06-30 HISTORY — DX: Other specified postprocedural states: Z98.890

## 2015-06-30 HISTORY — DX: Presence of spectacles and contact lenses: Z97.3

## 2015-06-30 HISTORY — DX: Other specified postprocedural states: R11.2

## 2015-06-30 HISTORY — PX: KNEE ARTHROSCOPY WITH LATERAL MENISECTOMY: SHX6193

## 2015-06-30 HISTORY — DX: Other allergic rhinitis: J30.89

## 2015-06-30 LAB — POCT HEMOGLOBIN-HEMACUE: HEMOGLOBIN: 11.5 g/dL — AB (ref 12.0–15.0)

## 2015-06-30 SURGERY — ARTHROSCOPY, KNEE, WITH LATERAL MENISCECTOMY
Anesthesia: General | Site: Knee | Laterality: Left

## 2015-06-30 MED ORDER — CEFAZOLIN SODIUM-DEXTROSE 2-4 GM/100ML-% IV SOLN
INTRAVENOUS | Status: AC
  Filled 2015-06-30: qty 100

## 2015-06-30 MED ORDER — ACETAMINOPHEN 500 MG PO TABS
ORAL_TABLET | ORAL | Status: AC
  Filled 2015-06-30: qty 2

## 2015-06-30 MED ORDER — ONDANSETRON HCL 4 MG/2ML IJ SOLN
INTRAMUSCULAR | Status: AC
Start: 2015-06-30 — End: 2015-06-30
  Filled 2015-06-30: qty 2

## 2015-06-30 MED ORDER — HYDROMORPHONE HCL 1 MG/ML IJ SOLN
0.5000 mg | INTRAMUSCULAR | Status: DC | PRN
  Filled 2015-06-30: qty 1

## 2015-06-30 MED ORDER — KETOROLAC TROMETHAMINE 30 MG/ML IJ SOLN
INTRAMUSCULAR | Status: AC
  Filled 2015-06-30: qty 1

## 2015-06-30 MED ORDER — HYDROCODONE-ACETAMINOPHEN 5-325 MG PO TABS: 1.0000 | ORAL_TABLET | ORAL | Status: DC | PRN

## 2015-06-30 MED ORDER — ONDANSETRON HCL 4 MG/2ML IJ SOLN
4.0000 mg | Freq: Four times a day (QID) | INTRAMUSCULAR | Status: DC | PRN
  Filled 2015-06-30: qty 2

## 2015-06-30 MED ORDER — SCOPOLAMINE 1 MG/3DAYS TD PT72
MEDICATED_PATCH | TRANSDERMAL | Status: AC
  Filled 2015-06-30: qty 1

## 2015-06-30 MED ORDER — ACETAMINOPHEN 325 MG PO TABS
ORAL_TABLET | ORAL | Status: DC | PRN
  Administered 2015-06-30: 1000 mg via ORAL

## 2015-06-30 MED ORDER — CHLORHEXIDINE GLUCONATE 4 % EX LIQD
60.0000 mL | Freq: Once | CUTANEOUS | Status: DC
  Filled 2015-06-30: qty 60

## 2015-06-30 MED ORDER — OXYCODONE HCL 5 MG PO TABS
ORAL_TABLET | ORAL | Status: AC
  Filled 2015-06-30: qty 1

## 2015-06-30 MED ORDER — LIDOCAINE-EPINEPHRINE 1 %-1:100000 IJ SOLN
INTRAMUSCULAR | Status: DC | PRN
  Administered 2015-06-30: 20 mL

## 2015-06-30 MED ORDER — ONDANSETRON HCL 4 MG/2ML IJ SOLN
INTRAMUSCULAR | Status: DC | PRN
  Administered 2015-06-30: 4 mg via INTRAVENOUS

## 2015-06-30 MED ORDER — FENTANYL CITRATE (PF) 100 MCG/2ML IJ SOLN
INTRAMUSCULAR | Status: AC
  Filled 2015-06-30: qty 2

## 2015-06-30 MED ORDER — METOCLOPRAMIDE HCL 5 MG PO TABS
5.0000 mg | ORAL_TABLET | Freq: Three times a day (TID) | ORAL | Status: DC | PRN
  Filled 2015-06-30: qty 2

## 2015-06-30 MED ORDER — SODIUM CHLORIDE 0.9 % IR SOLN
Status: DC | PRN
  Administered 2015-06-30: 1000 mL
  Administered 2015-06-30: 6000 mL
  Administered 2015-06-30: 1000 mL

## 2015-06-30 MED ORDER — KETOROLAC TROMETHAMINE 30 MG/ML IJ SOLN
INTRAMUSCULAR | Status: DC | PRN
  Administered 2015-06-30: 30 mg via INTRAVENOUS

## 2015-06-30 MED ORDER — ONDANSETRON HCL 4 MG PO TABS
4.0000 mg | ORAL_TABLET | Freq: Four times a day (QID) | ORAL | Status: DC | PRN
  Filled 2015-06-30: qty 1

## 2015-06-30 MED ORDER — FENTANYL CITRATE (PF) 100 MCG/2ML IJ SOLN
INTRAMUSCULAR | Status: DC | PRN
  Administered 2015-06-30: 25 ug via INTRAVENOUS
  Administered 2015-06-30: 50 ug via INTRAVENOUS
  Administered 2015-06-30: 25 ug via INTRAVENOUS

## 2015-06-30 MED ORDER — LIDOCAINE HCL (CARDIAC) 20 MG/ML IV SOLN
INTRAVENOUS | Status: AC
  Filled 2015-06-30: qty 5

## 2015-06-30 MED ORDER — PROPOFOL 10 MG/ML IV BOLUS
INTRAVENOUS | Status: AC
  Filled 2015-06-30: qty 40

## 2015-06-30 MED ORDER — DEXAMETHASONE SODIUM PHOSPHATE 4 MG/ML IJ SOLN
INTRAMUSCULAR | Status: DC | PRN
  Administered 2015-06-30: 10 mg via INTRAVENOUS

## 2015-06-30 MED ORDER — MIDAZOLAM HCL 5 MG/5ML IJ SOLN
INTRAMUSCULAR | Status: DC | PRN
  Administered 2015-06-30: 2 mg via INTRAVENOUS

## 2015-06-30 MED ORDER — METOCLOPRAMIDE HCL 5 MG/ML IJ SOLN
5.0000 mg | Freq: Three times a day (TID) | INTRAMUSCULAR | Status: DC | PRN
  Filled 2015-06-30: qty 2

## 2015-06-30 MED ORDER — MIDAZOLAM HCL 2 MG/2ML IJ SOLN
INTRAMUSCULAR | Status: AC
  Filled 2015-06-30: qty 2

## 2015-06-30 MED ORDER — MEPERIDINE HCL 25 MG/ML IJ SOLN
6.2500 mg | INTRAMUSCULAR | Status: DC | PRN
  Filled 2015-06-30: qty 1

## 2015-06-30 MED ORDER — PROPOFOL 10 MG/ML IV BOLUS
INTRAVENOUS | Status: DC | PRN
  Administered 2015-06-30: 100 mg via INTRAVENOUS
  Administered 2015-06-30: 300 mg via INTRAVENOUS

## 2015-06-30 MED ORDER — MIDAZOLAM HCL 2 MG/2ML IJ SOLN
0.5000 mg | Freq: Once | INTRAMUSCULAR | Status: DC | PRN
  Filled 2015-06-30: qty 2

## 2015-06-30 MED ORDER — METHOCARBAMOL 500 MG PO TABS
ORAL_TABLET | ORAL | Status: AC
  Filled 2015-06-30: qty 1

## 2015-06-30 MED ORDER — DEXAMETHASONE SODIUM PHOSPHATE 10 MG/ML IJ SOLN
INTRAMUSCULAR | Status: AC
  Filled 2015-06-30: qty 1

## 2015-06-30 MED ORDER — OXYCODONE HCL 5 MG PO TABS
5.0000 mg | ORAL_TABLET | Freq: Once | ORAL | Status: AC
Start: 2015-06-30 — End: 2015-06-30
  Administered 2015-06-30: 5 mg via ORAL
  Filled 2015-06-30: qty 1

## 2015-06-30 MED ORDER — SCOPOLAMINE 1 MG/3DAYS TD PT72
1.0000 | MEDICATED_PATCH | TRANSDERMAL | Status: DC
  Administered 2015-06-30: 1.5 mg via TRANSDERMAL
  Filled 2015-06-30: qty 1

## 2015-06-30 MED ORDER — OXYCODONE-ACETAMINOPHEN 5-325 MG PO TABS
1.0000 | ORAL_TABLET | ORAL | Status: DC | PRN
  Filled 2015-06-30: qty 2

## 2015-06-30 MED ORDER — SODIUM CHLORIDE 0.9 % IV SOLN
INTRAVENOUS | Status: DC
  Filled 2015-06-30: qty 1000

## 2015-06-30 MED ORDER — LACTATED RINGERS IV SOLN
INTRAVENOUS | Status: DC
Start: 2015-06-30 — End: 2015-06-30
  Administered 2015-06-30: 07:00:00 via INTRAVENOUS
  Filled 2015-06-30: qty 1000

## 2015-06-30 MED ORDER — FENTANYL CITRATE (PF) 100 MCG/2ML IJ SOLN
25.0000 ug | INTRAMUSCULAR | Status: DC | PRN
  Administered 2015-06-30 (×3): 25 ug via INTRAVENOUS
  Filled 2015-06-30: qty 1

## 2015-06-30 MED ORDER — CEFAZOLIN SODIUM-DEXTROSE 2-4 GM/100ML-% IV SOLN
2.0000 g | INTRAVENOUS | Status: AC
  Administered 2015-06-30: 2 g via INTRAVENOUS
  Filled 2015-06-30: qty 100

## 2015-06-30 MED ORDER — PROMETHAZINE HCL 25 MG/ML IJ SOLN
6.2500 mg | INTRAMUSCULAR | Status: DC | PRN
  Filled 2015-06-30: qty 1

## 2015-06-30 MED ORDER — POVIDONE-IODINE 10 % EX SWAB
2.0000 "application " | Freq: Once | CUTANEOUS | Status: DC
  Filled 2015-06-30: qty 2

## 2015-06-30 MED ORDER — LIDOCAINE HCL (CARDIAC) 20 MG/ML IV SOLN
INTRAVENOUS | Status: DC | PRN
  Administered 2015-06-30: 100 mg via INTRAVENOUS

## 2015-06-30 MED ORDER — METHOCARBAMOL 500 MG PO TABS
500.0000 mg | ORAL_TABLET | Freq: Four times a day (QID) | ORAL | Status: DC | PRN
  Administered 2015-06-30: 500 mg via ORAL
  Filled 2015-06-30: qty 1

## 2015-06-30 MED ORDER — METHOCARBAMOL 1000 MG/10ML IJ SOLN
500.0000 mg | Freq: Four times a day (QID) | INTRAMUSCULAR | Status: DC | PRN
  Filled 2015-06-30: qty 5

## 2015-06-30 MED ORDER — ASPIRIN EC 325 MG PO TBEC: 325.0000 mg | DELAYED_RELEASE_TABLET | Freq: Two times a day (BID) | ORAL | Status: DC

## 2015-06-30 SURGICAL SUPPLY — 42 items
BANDAGE ELASTIC 6 VELCRO ST LF (GAUZE/BANDAGES/DRESSINGS) ×3 IMPLANT
BLADE CUDA 4.2 (BLADE) IMPLANT
BLADE CUDA SHAVER 3.5 (BLADE) ×3 IMPLANT
CHLORAPREP W/TINT 26ML (MISCELLANEOUS) ×3 IMPLANT
DRAPE ARTHROSCOPY W/POUCH 114 (DRAPES) ×3 IMPLANT
DRAPE U-SHAPE 47X51 STRL (DRAPES) ×3 IMPLANT
DRSG EMULSION OIL 3X3 NADH (GAUZE/BANDAGES/DRESSINGS) ×3 IMPLANT
DRSG PAD ABDOMINAL 8X10 ST (GAUZE/BANDAGES/DRESSINGS) ×3 IMPLANT
ELECT MENISCUS 165MM 90D (ELECTRODE) IMPLANT
ELECT REM PT RETURN 9FT ADLT (ELECTROSURGICAL)
ELECTRODE REM PT RTRN 9FT ADLT (ELECTROSURGICAL) IMPLANT
GAUZE SPONGE 4X4 12PLY STRL (GAUZE/BANDAGES/DRESSINGS) ×3 IMPLANT
GLOVE BIO SURGEON STRL SZ8.5 (GLOVE) ×3 IMPLANT
GLOVE INDICATOR 8.5 STRL (GLOVE) ×3 IMPLANT
GOWN STRL REUS W/TWL 2XL LVL3 (GOWN DISPOSABLE) ×3 IMPLANT
GOWN STRL REUS W/TWL XL LVL3 (GOWN DISPOSABLE) ×3 IMPLANT
HOLDER KNEE FOAM BLUE (MISCELLANEOUS) IMPLANT
IV NS IRRIG 3000ML ARTHROMATIC (IV SOLUTION) ×6 IMPLANT
KIT ROOM TURNOVER WOR (KITS) ×3 IMPLANT
KNEE WRAP E Z 3 GEL PACK (MISCELLANEOUS) ×3 IMPLANT
MANIFOLD NEPTUNE II (INSTRUMENTS) ×3 IMPLANT
NDL SAFETY ECLIPSE 18X1.5 (NEEDLE) ×1 IMPLANT
NEEDLE HYPO 18GX1.5 SHARP (NEEDLE) ×2
PACK ARTHROSCOPY DSU (CUSTOM PROCEDURE TRAY) ×3 IMPLANT
PACK BASIN DAY SURGERY FS (CUSTOM PROCEDURE TRAY) ×3 IMPLANT
PAD FOR LEG HOLDER (MISCELLANEOUS) IMPLANT
PADDING CAST ABS 6INX4YD NS (CAST SUPPLIES) ×2
PADDING CAST ABS COTTON 6X4 NS (CAST SUPPLIES) ×1 IMPLANT
PADDING CAST COTTON 6X4 STRL (CAST SUPPLIES) ×3 IMPLANT
PENCIL BUTTON HOLSTER BLD 10FT (ELECTRODE) IMPLANT
SET ARTHROSCOPY TUBING (MISCELLANEOUS) ×2
SET ARTHROSCOPY TUBING LN (MISCELLANEOUS) ×1 IMPLANT
SPONGE GAUZE 4X4 12PLY STER LF (GAUZE/BANDAGES/DRESSINGS) ×3 IMPLANT
SUT ETHILON 3 0 PS 1 (SUTURE) ×3 IMPLANT
SUT ETHILON 4 0 PS 2 18 (SUTURE) IMPLANT
SYR 30ML LL (SYRINGE) ×3 IMPLANT
TOWEL OR 17X24 6PK STRL BLUE (TOWEL DISPOSABLE) ×3 IMPLANT
TUBE CONNECTING 12'X1/4 (SUCTIONS) ×1
TUBE CONNECTING 12X1/4 (SUCTIONS) ×2 IMPLANT
WAND 30 DEG SABER W/CORD (SURGICAL WAND) IMPLANT
WAND 90 DEG TURBOVAC W/CORD (SURGICAL WAND) IMPLANT
WATER STERILE IRR 500ML POUR (IV SOLUTION) ×3 IMPLANT

## 2015-06-30 NOTE — Anesthesia Procedure Notes (Signed)
Procedure Name: LMA Insertion Date/Time: 06/30/2015 7:38 AM Performed by: Maris BergerENENNY, Melinda Reeves Pre-anesthesia Checklist: Patient identified, Emergency Drugs available, Suction available and Patient being monitored Patient Re-evaluated:Patient Re-evaluated prior to inductionOxygen Delivery Method: Circle System Utilized Preoxygenation: Pre-oxygenation with 100% oxygen Intubation Type: IV induction Ventilation: Mask ventilation without difficulty LMA: LMA inserted LMA Size: 5.0 Number of attempts: 1 Airway Equipment and Method: bite block Placement Confirmation: positive ETCO2 Dental Injury: Teeth and Oropharynx as per pre-operative assessment

## 2015-06-30 NOTE — Anesthesia Postprocedure Evaluation (Signed)
Anesthesia Post Note  Patient: Clemetine MarkerDonna Dyk  Procedure(s) Performed: Procedure(s) (LRB): LEFT KNEE ARTHROSCOPY WITH PARTIAL LATERAL MENISECTOMY (Left)  Patient location during evaluation: PACU Anesthesia Type: General Level of consciousness: awake and alert, oriented and patient cooperative Pain management: pain level controlled Vital Signs Assessment: post-procedure vital signs reviewed and stable Respiratory status: spontaneous breathing, nonlabored ventilation and respiratory function stable Cardiovascular status: blood pressure returned to baseline and stable Postop Assessment: no signs of nausea or vomiting Anesthetic complications: no    Last Vitals:  Filed Vitals:   06/30/15 1015 06/30/15 1030  BP: 110/58 116/57  Pulse: 67 69  Temp:    Resp: 12 15    Last Pain:  Filed Vitals:   06/30/15 1032  PainSc: 4                  Marchello Rothgeb,E. Deepti Gunawan

## 2015-06-30 NOTE — Discharge Instructions (Signed)
° °Dr. Brian Swinteck °Hip & Knee Reconstruction °[facility] °[address] °[place], [place] [address] °[phone] ° ° °Arthroscopic Procedure, Knee °An arthroscopic procedure can find what is wrong with your knee. °PROCEDURE °Arthroscopy is a surgical technique that allows your orthopedic surgeon to diagnose and treat your knee injury with accuracy. They will look into your knee through a small instrument. This is almost like a small (pencil sized) telescope. Because arthroscopy affects your knee less than open knee surgery, you can anticipate a more rapid recovery. Taking an active role by following your caregiver's instructions will help with rapid and complete recovery. Use crutches, rest, elevation, ice, and knee exercises as instructed. The length of recovery depends on various factors including type of injury, age, physical condition, medical conditions, and your rehabilitation. °Your knee is the joint between the large bones (femur and tibia) in your leg. Cartilage covers these bone ends which are smooth and slippery and allow your knee to bend and move smoothly. Two menisci, thick, semi-lunar shaped pads of cartilage which form a rim inside the joint, help absorb shock and stabilize your knee. Ligaments bind the bones together and support your knee joint. Muscles move the joint, help support your knee, and take stress off the joint itself. Because of this all programs and physical therapy to rehabilitate an injured or repaired knee require rebuilding and strengthening your muscles. °AFTER THE PROCEDURE °· After the procedure, you will be moved to a recovery area until most of the effects of the medication have worn off. Your caregiver will discuss the test results with you.  °· Only take over-the-counter or prescription medicines for pain, discomfort, or fever as directed by your caregiver.  °SEEK MEDICAL CARE IF:  °· You have increased bleeding from your wounds.  °· You see  redness, swelling, or have increasing pain in your wounds.  °· You have pus coming from your wound.  °· You have an oral temperature above 102° F (38.9° C).  °· You notice a bad smell coming from the wound or dressing.  °· You have severe pain with any motion of your knee.  °SEEK IMMEDIATE MEDICAL CARE IF:  °· You develop a rash.  °· You have difficulty breathing.  °· You have any allergic problems.  °FURTHER INSTRUCTIONS:  °· ICE to the affected knee every three hours for 30 minutes at a time and then as needed for pain and swelling.  Continue to use ice on the knee for pain and swelling from surgery. You may notice swelling that will progress down to the foot and ankle.  This is normal after surgery.  Elevate the leg when you are not up walking on it.   ° °DIET °You may resume your previous home diet once your are discharged from the hospital. ° °DRESSING / WOUND CARE / SHOWERING °You may change your dressing 3-5 days after surgery.  Then change the dressing every day with sterile gauze.  Please use good hand washing techniques before changing the dressing.  Do not use any lotions or creams on the incision until instructed by your surgeon. °You may start showering two days after being discharged home but do not submerge the incisions under water.  °Change dressing 48 hours after the procedure and then cover the small incisions with band aids until your follow up visit. °Change the surgical dressings daily and reapply a dry dressing each time.  ° °ACTIVITY °Walk with your walker as instructed. °Use walker as long   as suggested by your caregivers. °Avoid periods of inactivity such as sitting longer than an hour when not asleep. This helps prevent blood clots.  °You may resume a sexual relationship in one month or when given the OK by your doctor.  °You may return to work once you are cleared by your doctor.  °Do not drive a car for 6 weeks or until released by you surgeon.  °Do not drive while taking  narcotics. ° °WEIGHT BEARING °Weight bearing as tolerated with assist device (walker, cane, etc) as directed, use it as long as suggested by your surgeon or therapist, typically at least 4-6 weeks. ° °POSTOPERATIVE CONSTIPATION PROTOCOL °Constipation - defined medically as fewer than three stools per week and severe constipation as less than one stool per week. ° °One of the most common issues patients have following surgery is constipation.  Even if you have a regular bowel pattern at home, your normal regimen is likely to be disrupted due to multiple reasons following surgery.  Combination of anesthesia, postoperative narcotics, change in appetite and fluid intake all can affect your bowels.  In order to avoid complications following surgery, here are some recommendations in order to help you during your recovery period. ° °Colace (docusate) - Pick up an over-the-counter form of Colace or another stool softener and take twice a day as long as you are requiring postoperative pain medications.  Take with a full glass of water daily.  If you experience loose stools or diarrhea, hold the colace until you stool forms back up.  If your symptoms do not get better within 1 week or if they get worse, check with your doctor. ° °Dulcolax (bisacodyl) - Pick up over-the-counter and take as directed by the product packaging as needed to assist with the movement of your bowels.  Take with a full glass of water.  Use this product as needed if not relieved by Colace only.  ° °MiraLax (polyethylene glycol) - Pick up over-the-counter to have on hand.  MiraLax is a solution that will increase the amount of water in your bowels to assist with bowel movements.  Take as directed and can mix with a glass of water, juice, soda, coffee, or tea.  Take if you go more than two days without a movement. °Do not use MiraLax more than once per day. Call your doctor if you are still constipated or irregular after using this medication for 7 days  in a row. ° °If you continue to have problems with postoperative constipation, please contact the office for further assistance and recommendations.  If you experience "the worst abdominal pain ever" or develop nausea or vomiting, please contact the office immediatly for further recommendations for treatment. ° °ITCHING ° If you experience itching with your medications, try taking only a single pain pill, or even half a pain pill at a time.  You can also use Benadryl over the counter for itching or also to help with sleep.  ° °TED HOSE STOCKINGS °Wear the elastic stockings on both legs for three weeks following surgery during the day but you may remove then at night for sleeping. ° °MEDICATIONS °See your medication summary on the “After Visit Summary” that the nursing staff will review with you prior to discharge.  You may have some home medications which will be placed on hold until you complete the course of blood thinner medication.  It is important for you to complete the blood thinner medication as prescribed by your surgeon.  Continue   your approved medications as instructed at time of discharge. °Do not drive while taking narcotics.  ° °PRECAUTIONS °If you experience chest pain or shortness of breath - call 911 immediately for transfer to the hospital emergency department.  °If you develop a fever greater that 101 F, purulent drainage from wound, increased redness or drainage from wound, foul odor from the wound/dressing, or calf pain - CONTACT YOUR SURGEON.   °                                                °FOLLOW-UP APPOINTMENTS °Make sure you keep all of your appointments after your operation with your surgeon and caregivers. You should call the office at [phone]  and make an appointment for approximately one week after the date of your surgery or on the date instructed by your surgeon outlined in the "After Visit Summary". ° °RANGE OF MOTION AND STRENGTHENING EXERCISES  °Rehabilitation of the knee  is important following a knee injury or an operation. After just a few days of immobilization, the muscles of the thigh which control the knee become weakened and shrink (atrophy). Knee exercises are designed to build up the tone and strength of the thigh muscles and to improve knee motion. Often times heat used for twenty to thirty minutes before working out will loosen up your tissues and help with improving the range of motion but do not use heat for the first two weeks following surgery. These exercises can be done on a training (exercise) mat, on the floor, on a table or on a bed. Use what ever works the best and is most comfortable for you Knee exercises include: ° °QUAD STRENGTHENING EXERCISES °Strengthening Quadriceps Sets ° °Tighten muscles on top of thigh by pushing knees down into floor or table. °Hold for 20 seconds. Repeat 10 times. °Do 2 sessions per day. ° ° ° ° °Strengthening Terminal Knee Extension ° °With knee bent over bolster, straighten knee by tightening muscle on top of thigh. Be sure to keep bottom of knee on bolster. °Hold for 20 seconds. Repeat 10 times. °Do 2 sessions per day. ° ° °Straight Leg with Bent Knee ° °Lie on back with opposite leg bent. Keep involved knee slightly bent at knee and raise leg 4-6". Hold for 10 seconds. °Repeat 20 times per set. °Do 2 sets per session. °Do 2 sessions per day. ° ° °Post Anesthesia Home Care Instructions ° °Activity: °Get plenty of rest for the remainder of the day. A responsible adult should stay with you for 24 hours following the procedure.  °For the next 24 hours, DO NOT: °-Drive a car °-Operate machinery °-Drink alcoholic beverages °-Take any medication unless instructed by your physician °-Make any legal decisions or sign important papers. ° °Meals: °Start with liquid foods such as gelatin or soup. Progress to regular foods as tolerated. Avoid greasy, spicy, heavy foods. If nausea and/or vomiting occur, drink only clear liquids until the nausea  and/or vomiting subsides. Call your physician if vomiting continues. ° °Special Instructions/Symptoms: °Your throat may feel dry or sore from the anesthesia or the breathing tube placed in your throat during surgery. If this causes discomfort, gargle with warm salt water. The discomfort should disappear within 24 hours. ° °If you had a scopolamine patch placed behind your ear for the management of post- operative nausea and/or vomiting: ° °  1. The medication in the patch is effective for 72 hours, after which it should be removed.  Wrap patch in a tissue and discard in the trash. Wash hands thoroughly with soap and water. °2. You may remove the patch earlier than 72 hours if you experience unpleasant side effects which may include dry mouth, dizziness or visual disturbances. °3. Avoid touching the patch. Wash your hands with soap and water after contact with the patch. °  ° °

## 2015-06-30 NOTE — Transfer of Care (Signed)
Immediate Anesthesia Transfer of Care Note  Patient: Melinda Reeves  Procedure(s) Performed: Procedure(s): LEFT KNEE ARTHROSCOPY WITH PARTIAL LATERAL MENISECTOMY (Left)  Patient Location: PACU  Anesthesia Type:General  Level of Consciousness: awake, alert  and oriented  Airway & Oxygen Therapy: Patient Spontanous Breathing and Patient connected to nasal cannula oxygen  Post-op Assessment: Report given to RN  Post vital signs: Reviewed and stable  Last Vitals: 125/49 Filed Vitals:   06/30/15 0606 06/30/15 0930  BP: 122/61   Pulse: 63 81  Temp: 36.5 C 36.4 C  Resp: 16 16    Complications: No apparent anesthesia complications

## 2015-06-30 NOTE — Anesthesia Preprocedure Evaluation (Addendum)
Anesthesia Evaluation  Patient identified by MRN, date of birth, ID band Patient awake    Reviewed: Allergy & Precautions, NPO status , Patient's Chart, lab work & pertinent test results  History of Anesthesia Complications (+) PONV and history of anesthetic complications  Airway Mallampati: I  TM Distance: >3 FB Neck ROM: Full    Dental  (+) Dental Advisory Given   Pulmonary former smoker (quit 1980),    breath sounds clear to auscultation       Cardiovascular (-) angina Rhythm:Regular Rate:Normal  '14 ECHO: normal LVF, EF 55-65%, valves OK '14 stress: normal perfusion   Neuro/Psych negative neurological ROS     GI/Hepatic Neg liver ROS, GERD  Medicated and Controlled,  Endo/Other  Morbid obesity  Renal/GU negative Renal ROS     Musculoskeletal   Abdominal (+) + obese,   Peds  Hematology negative hematology ROS (+)   Anesthesia Other Findings   Reproductive/Obstetrics                           Anesthesia Physical Anesthesia Plan  ASA: II  Anesthesia Plan: General   Post-op Pain Management:    Induction: Intravenous  Airway Management Planned: LMA  Additional Equipment:   Intra-op Plan:   Post-operative Plan:   Informed Consent: I have reviewed the patients History and Physical, chart, labs and discussed the procedure including the risks, benefits and alternatives for the proposed anesthesia with the patient or authorized representative who has indicated his/her understanding and acceptance.   Dental advisory given  Plan Discussed with: CRNA and Surgeon  Anesthesia Plan Comments: (Plan routine monitors, GA- LMA OK)        Anesthesia Quick Evaluation

## 2015-06-30 NOTE — H&P (Signed)
PREOPERATIVE H&P  Chief Complaint: left knee lateral meniscal tear  HPI: Melinda Reeves is a 55 y.o. female who presents for preoperative history and physical with a diagnosis of left knee lateral meniscal tear. Symptoms are rated as moderate to severe, and have been worsening.  This is significantly impairing activities of daily living.  She has elected for surgical management.   Past Medical History  Diagnosis Date  . GERD (gastroesophageal reflux disease)   . Hyperlipidemia   . Acute meniscal tear of left knee   . PONV (postoperative nausea and vomiting)   . Environmental and seasonal allergies   . SUI (stress urinary incontinence, female)   . Wears glasses    Past Surgical History  Procedure Laterality Date  . Cesarean section  1996  . Excision left breast mass  07-28-2004    benign  . Transthoracic echocardiogram  01-19-2013    normal LVF, ef 55-65%/  trivial MR and TR/  mild LAE  . Cardiovascular stress test  01-07-2013    normal nuclear perfusion study w/ no ischemia/  normal LV function and wall motion, ef 69%  . Pilonidal cyst excision  1990   Social History   Social History  . Marital Status: Married    Spouse Name: N/A  . Number of Children: N/A  . Years of Education: N/A   Social History Main Topics  . Smoking status: Former Smoker -- .5 years    Quit date: 06/27/1978  . Smokeless tobacco: Never Used  . Alcohol Use: 0.5 oz/week    1 Standard drinks or equivalent per week     Comment: social  . Drug Use: No  . Sexual Activity: Not Asked   Other Topics Concern  . None   Social History Narrative   Family History  Problem Relation Age of Onset  . Heart disease Father   . Diabetes Father   . Hyperlipidemia Father   . Hypertension Father   . COPD Maternal Grandmother   . Breast cancer Maternal Grandmother   . Cancer - Colon Maternal Grandmother   . Cancer - Colon Maternal Grandfather   . Heart disease Paternal Grandfather   . Cancer - Colon Mother   .  Breast cancer Mother   . Uterine cancer Mother   . Breast cancer Paternal Grandmother   . Heart disease Paternal Grandmother   . Hypertension Paternal Grandmother    Allergies  Allergen Reactions  . Latex Rash   Prior to Admission medications   Medication Sig Start Date End Date Taking? Authorizing Provider  acetaminophen (TYLENOL) 500 MG tablet Take 1,000 mg by mouth every 6 (six) hours as needed.   Yes Historical Provider, MD  albuterol (PROAIR HFA) 108 (90 BASE) MCG/ACT inhaler Inhale 1-2 puffs into the lungs every 6 (six) hours as needed for wheezing or shortness of breath.   Yes Historical Provider, MD  benzonatate (TESSALON) 100 MG capsule Take 100 mg by mouth 3 (three) times daily as needed for cough.   Yes Historical Provider, MD  Cholecalciferol (VITAMIN D3) 1000 units CAPS Take 1 capsule by mouth 2 (two) times daily.   Yes Historical Provider, MD  meloxicam (MOBIC) 15 MG tablet Take 1 tablet by mouth as needed. 04/08/15  Yes Historical Provider, MD  omeprazole (PRILOSEC) 40 MG capsule Take 40 mg by mouth every morning.   Yes Historical Provider, MD  valACYclovir (VALTREX) 1000 MG tablet Take 1,000 mg by mouth as needed (for fever blisters).    Historical Provider, MD  Positive ROS: All other systems have been reviewed and were otherwise negative with the exception of those mentioned in the HPI and as above.  Physical Exam: General: Alert, no acute distress Cardiovascular: No pedal edema Respiratory: No cyanosis, no use of accessory musculature GI: No organomegaly, abdomen is soft and non-tender Skin: No lesions in the area of chief complaint Neurologic: Sensation intact distally Psychiatric: Patient is competent for consent with normal mood and affect Lymphatic: No axillary or cervical lymphadenopathy  MUSCULOSKELETAL: L knee: + TTP lateral joint line with (+) McMurray  Assessment: left knee lateral meniscal tear  Plan: Plan for Procedure(s): LEFT KNEE ARTHROSCOPY  WITH PARTIAL LATERAL MENISECTOMY  The risks benefits and alternatives were discussed with the patient including but not limited to the risks of nonoperative treatment, versus surgical intervention including infection, bleeding, nerve injury,  blood clots, cardiopulmonary complications, morbidity, mortality, among others, and they were willing to proceed.   Seanpaul Preece, Cloyde Reams, MD Cell [phone]   06/30/2015 7:27 AM

## 2015-07-01 NOTE — Op Note (Signed)
NAME:  Melinda Reeves, Melinda Reeves                      ACCOUNT NO.:  MEDICAL RECORD NO.:  [id number]  LOCATION:                                 FACILITY:  PHYSICIAN:  Samson Frederic, MD     DATE OF BIRTH:  September 20, 1960  DATE OF PROCEDURE:  06/30/2015 DATE OF DISCHARGE:                              OPERATIVE REPORT   SURGEON:  Samson Frederic, MD.  ASSISTANT:  None.  PREOPERATIVE DIAGNOSIS:  Degenerative lateral meniscal tear, left knee.  POSTOPERATIVE DIAGNOSIS:  Degenerative lateral meniscal tear, left knee.  PROCEDURE PERFORMED:  Left knee arthroscopy with partial lateral meniscectomy and chondroplasty of the lateral compartment.  ANESTHESIA:  General.  EBL:  Minimal.  COMPLICATIONS:  None.  CONDITION:  Stable to PACU.  INDICATIONS:  The patient is a 55 year old female with an MRI-documented degenerative lateral meniscal tear to the anterior portion of the meniscus with displaced flap.  This tear has been symptomatic with mechanical symptoms.  She has tried conservative management including injections, physical therapy, anti-inflammatory medications with no benefit. She continues to have mechanical symptoms.  She does have known osteoarthritis. Risks, benefits and alternatives to arthroscopic debridement of her left knee were explained and she elected to proceed.  DESCRIPTION OF PROCEDURE IN DETAIL:  The patient was identified in the holding area using two identifiers.  I marked the surgical site.  She was then taken to the operating room and general anesthesia was induced. A tourniquet was placed on the thigh and the lower extremity was prepped and draped in the usual sterile fashion.  Time-out was called verifying the side and site of surgery.  She did receive IV antibiotics within 60 minutes at beginning of the procedure.  I began by making a standard superolateral outflow portal. Inferolateral portal was made and the arthroscopic instrumentation was inserted into the knee.   Arthroscopic visualization of the knee proceeded.  The undersurface of the patella and trochlea were visualized and found to have grade 3 changes to both the patella and the trochlear groove.  I then visualized the medial and lateral gutters and there were no loose bodies found.  Flexion and valgus force was applied to the knee and the medial compartment was entered.  I passed a spinal needle into the joint, staying above the meniscus and the site was marked for the inferomedial portal.  A small incision was made and the skin was spread with a hemostat.  I then inserted the dilator into the joint.  I inserted the probe and I probed the medial meniscus, which was found to be stable without any tear.  She had some grade 2 changes to the medial compartment.    I then proceeded to the notch.  The ACL was intact.  It was probed and found to be stable.  I then was able to visualize the anterior portion of the lateral meniscus, which was actually flipped out of the joint.  I inserted a sucker shaver and I debrided the unstable aspect of the lateral meniscus.  Figure of 4 position was then obtained and I entered the lateral compartment.  The posterior aspect of the lateral meniscus was  found to be intact.  The popliteus was intact.  I then used the sucker shaver to start balancing the meniscus beginning at about the middle aspect of the body proceeding anteriorly.  I then switched the portals. I then continued to debride unstable anterior meniscal tissue.  At the end of the debridement, the inferior leaflet of the meniscus remained and this was stable.  I inspected this through a full range of motion, switching the scope from the inferomedial to the inferolateral portal. Once I was satisfied that the lateral meniscus was stable, I then copiously irrigated the knee.  I turned my attention to the lateral compartment, she was found to have diffuse grade 3 changes especially to the medial femoral  condyle and the weightbearing surface of the tibia. I performed a chondroplasty with a sucker shaver to stable edges.  The joint was again inspected and there were no tears, defects or loose bodies identified.  The arthroscopic equipment was then removed and the portals were closed with 3-0 interrupted nylon.  A 20 mL of 0.25% Marcaine with epinephrine was injected into the suprapatellar pouch and then a bulky sterile dressing was applied.  The patient was then awakened and transported to recovery in stable condition.  There were no known complications.  Counts were correct.          ______________________________ Samson FredericBrian Vernon Maish, MD     BS/MEDQ  D:  06/30/2015  T:  06/30/2015  Job:  [id number]

## 2015-07-04 ENCOUNTER — Encounter (HOSPITAL_BASED_OUTPATIENT_CLINIC_OR_DEPARTMENT_OTHER): Payer: Self-pay | Admitting: Orthopedic Surgery

## 2015-08-25 ENCOUNTER — Encounter (HOSPITAL_COMMUNITY): Payer: 59

## 2015-08-25 ENCOUNTER — Ambulatory Visit (HOSPITAL_COMMUNITY)
Admission: RE | Admit: 2015-08-25 | Discharge: 2015-08-25 | Disposition: A | Discharge status: Home / Self Care | Payer: 59 | Source: Ambulatory Visit | Attending: Cardiology | Admitting: Cardiology

## 2015-08-25 ENCOUNTER — Other Ambulatory Visit (HOSPITAL_COMMUNITY): Payer: Self-pay | Admitting: Orthopedic Surgery

## 2015-08-25 DIAGNOSIS — K219 Gastro-esophageal reflux disease without esophagitis: Secondary | ICD-10-CM | POA: Diagnosis not present

## 2015-08-25 DIAGNOSIS — M79662 Pain in left lower leg: Secondary | ICD-10-CM | POA: Diagnosis not present

## 2015-08-25 DIAGNOSIS — E785 Hyperlipidemia, unspecified: Secondary | ICD-10-CM | POA: Diagnosis not present

## 2016-07-26 ENCOUNTER — Encounter: Payer: Self-pay | Admitting: Podiatry

## 2016-07-26 ENCOUNTER — Ambulatory Visit (INDEPENDENT_AMBULATORY_CARE_PROVIDER_SITE_OTHER): Payer: 59 | Admitting: Podiatry

## 2016-07-26 ENCOUNTER — Ambulatory Visit (INDEPENDENT_AMBULATORY_CARE_PROVIDER_SITE_OTHER): Payer: 59

## 2016-07-26 VITALS — Resp 16 | Ht 72.0 in | Wt 279.0 lb

## 2016-07-26 DIAGNOSIS — M722 Plantar fascial fibromatosis: Secondary | ICD-10-CM

## 2016-07-26 DIAGNOSIS — M779 Enthesopathy, unspecified: Secondary | ICD-10-CM

## 2016-07-26 DIAGNOSIS — M7661 Achilles tendinitis, right leg: Secondary | ICD-10-CM | POA: Diagnosis not present

## 2016-07-26 MED ORDER — TRIAMCINOLONE ACETONIDE 10 MG/ML IJ SUSP
10.0000 mg | Freq: Once | INTRAMUSCULAR | Status: AC
  Administered 2016-07-26: 10 mg

## 2016-07-26 NOTE — Patient Instructions (Signed)

## 2016-07-26 NOTE — Progress Notes (Signed)
Subjective:    Patient ID: Melinda Reeves, female   DOB: 56 y.o.   MRN: [id number]   HPI patient points to the back of the right heel stating it has been very sore and it's been hurting for over a month and makes shoe gear and possible. Patient also states she has bunions which are not bothering her that much    Review of Systems  All other systems reviewed and are negative.       Objective:  Physical Exam  Cardiovascular: Intact distal pulses.   Musculoskeletal: Normal range of motion.  Neurological: She is alert.  Skin: Skin is warm.  Nursing note and vitals reviewed.  Neurovascular status intact muscle strength was adequate range of motion within normal limits. Patient's found to have exquisite discomfort and inflammation of the medial side of the Achilles tendon insertion posterior right heel and has adductus foot type right. The area is very sore when pressed and localized and I did not note any dysfunction of the tendon and I did check function both right and left     Assessment:    Acute Achilles tendinitis right with inflammation mostly centered in the medial portion of the tendon     Plan:    H&P and x-rays reviewed. Today I did a careful injection after first explaining chances for rupture into the medial side of the right Achilles tendon at its insertion with dexamethasone Kenalog Xylocaine. I then instructed on ice therapy and gave her instructions on how to do this properly and I applied air fracture walker to completely immobilize the right lower leg. Patient be seen back 3 weeks or earlier if needed   X-ray indicated there is posterior spur formation and significant adductus foot type with structural bunion deformity associated with adductus foot type

## 2016-07-26 NOTE — Progress Notes (Signed)
   Subjective:    Patient ID: Melinda Reeves, female    DOB: June 04, 1960, 56 y.o.   MRN: [id number]  HPI Chief Complaint  Patient presents with  . Foot Pain    Right foot; back of heel; pt stated, "has had pain for past 4 weeks; hurts all day; sometimes gets shooting pain across the foot and back of foot"      Review of Systems  Musculoskeletal: Positive for gait problem.  All other systems reviewed and are negative.      Objective:   Physical Exam        Assessment & Plan:

## 2016-08-16 ENCOUNTER — Encounter: Payer: Self-pay | Admitting: Podiatry

## 2016-08-16 ENCOUNTER — Ambulatory Visit (INDEPENDENT_AMBULATORY_CARE_PROVIDER_SITE_OTHER): Payer: 59 | Admitting: Podiatry

## 2016-08-16 DIAGNOSIS — M7661 Achilles tendinitis, right leg: Secondary | ICD-10-CM

## 2016-08-16 DIAGNOSIS — M21619 Bunion of unspecified foot: Secondary | ICD-10-CM | POA: Diagnosis not present

## 2016-08-16 NOTE — Progress Notes (Signed)
Subjective:    Patient ID: Melinda Reeves, female   DOB: 56 y.o.   MRN: [id number]   HPI patient presents stating that she's feeling quite a bit better with mild discomfort and it seems when she wears shoes she's getting pressure against the back    ROS      Objective:  Physical Exam Neurovascular status found to be intact with patient found have mild inflammation still noted posterior medial heel but significantly improved with good dorsi and plantarflexion and good muscle strength    Assessment:   Achilles tendinitis right improving with inflammation still present but quite a bit better      Plan:   H&P discussed weaning off the boot at this time and dispensed silicone brace to take pressure off the back of the heel. Reappoint if symptoms were to persist

## 2021-09-11 ENCOUNTER — Encounter: Payer: Self-pay | Admitting: Cardiovascular Disease

## 2021-09-11 ENCOUNTER — Ambulatory Visit: Payer: Commercial Managed Care - PPO | Admitting: Cardiovascular Disease

## 2021-09-11 DIAGNOSIS — R351 Nocturia: Secondary | ICD-10-CM | POA: Diagnosis not present

## 2021-09-11 DIAGNOSIS — Z8249 Family history of ischemic heart disease and other diseases of the circulatory system: Secondary | ICD-10-CM

## 2021-09-11 DIAGNOSIS — I878 Other specified disorders of veins: Secondary | ICD-10-CM

## 2021-09-11 DIAGNOSIS — R000 Tachycardia, unspecified: Secondary | ICD-10-CM | POA: Diagnosis not present

## 2021-09-11 DIAGNOSIS — R0789 Other chest pain: Secondary | ICD-10-CM

## 2021-09-11 DIAGNOSIS — K219 Gastro-esophageal reflux disease without esophagitis: Secondary | ICD-10-CM

## 2021-09-11 DIAGNOSIS — R0683 Snoring: Secondary | ICD-10-CM

## 2021-09-11 DIAGNOSIS — G478 Other sleep disorders: Secondary | ICD-10-CM

## 2021-09-11 DIAGNOSIS — E669 Obesity, unspecified: Secondary | ICD-10-CM

## 2021-09-11 NOTE — Progress Notes (Signed)
Cardiology Office Note    Date:  09/15/2021   ID:  Melinda Reeves, DOB Aug 31, 1960, MRN [id number]  PCP:  Melinda Rossetti, PA-C  Cardiologist:  Melinda Guadalajara, Reeves   Reestablishment of cardiology care.  Patient was last seen by me on June 06, 2015.   History of Present Illness:  Melinda Reeves is a 61 y.o. female who is followed by Melinda House, PA-C at [facility] health at [place] [facility].  Melinda Reeves was initially seen by me in 2014 after she had experienced episodes of left-sided chest pain associated with mild episodes of shortness of breath.  She has a strong family history for premature CAD in her father who underwent CABG revascularization surgery and has a documented abdominal aortic aneurysm.  When seen in 2014, she had presented to the emergency room for chest discomfort.  Her ECG was unremarkable.  A CT angiogram was done due to right-sided pain which was negative for PE.  Cardiac enzymes were negative.  When I initially saw her, I recommended she undergo a 2D echo Doppler study as well as nuclear perfusion scan.  Her nuclear study done on January 07, 2013 showed normal perfusion and function without evidence for scar or ischemia.  A 2D echo Doppler study showed EF 55 to 65% with mild mitral annular calcification and normal diastolic function.  She has a history of mild asthma and is taking Symbicort and as needed albuterol.  I had seen her in 2017 for preoperative evaluation prior to undergoing left knee arthroscopic surgery by Dr. Linna Reeves.  I have not seen her since 2017.  She is now followed at [facility] network at [place] [facility].  For the past year she admits that she notices her heart racing when she lays down.  She has a history of GERD and takes pantoprazole.  She admits to being under increased stress.  She also experiences a stabbing pain in her left leg.  She admits to being active and cuts grass.  Oftentimes in the morning her heart rate can range from 70 to 114 bpm.  She also  experiences some nonexertional chest pain while sitting which radiates to her left arm.  Upon further questioning, she does not sleep well and admits to snoring, nocturia 2 times per night and is postmenopausal.  She presents to reestablish care with further evaluation of her above symptoms.   Past Medical History:  Diagnosis Date   Acute meniscal tear of left knee    Environmental and seasonal allergies    GERD (gastroesophageal reflux disease)    Hyperlipidemia    PONV (postoperative nausea and vomiting)    SUI (stress urinary incontinence, female)    Wears glasses     Past Surgical History:  Procedure Laterality Date   CARDIOVASCULAR STRESS TEST  01-07-2013   normal nuclear perfusion study w/ no ischemia/  normal LV function and wall motion, ef 69%   CESAREAN SECTION  1996   EXCISION LEFT BREAST MASS  07-28-2004   benign   KNEE ARTHROSCOPY WITH LATERAL MENISECTOMY Left 06/30/2015   Procedure: LEFT KNEE ARTHROSCOPY WITH PARTIAL LATERAL MENISECTOMY;  Surgeon: Melinda Reeves;  Location: [facility];  Service: Orthopedics;  Laterality: Left;   PILONIDAL CYST EXCISION  1990   TRANSTHORACIC ECHOCARDIOGRAM  01-19-2013   normal LVF, ef 55-65%/  trivial MR and TR/  mild LAE    Current Medications: Outpatient Medications Prior to Visit  Medication Sig Dispense Refill   atorvastatin (LIPITOR) 10  MG tablet Take 10 mg by mouth daily.     buPROPion (WELLBUTRIN XL) 150 MG 24 hr tablet Take 150 mg by mouth daily.     Cholecalciferol (VITAMIN D3) 1.25 MG (50000 UT) CAPS Take by mouth.     levothyroxine (SYNTHROID) 25 MCG tablet Take 25 mcg by mouth daily. Takes half (1/2) tablet daily     loratadine (CLARITIN) 10 MG tablet Take 1 tablet by mouth daily.     pantoprazole (PROTONIX) 40 MG tablet Take 40 mg by mouth daily.     phentermine (ADIPEX-P) 37.5 MG tablet Take 37.5 mg by mouth daily. Takes half (1/2) a tablet daily     valACYclovir (VALTREX) 1000 MG tablet Take 1,000 mg  by mouth as needed (for fever blisters).     Cholecalciferol (VITAMIN D3) 1000 units CAPS Take 1 capsule by mouth 2 (two) times daily.     albuterol (VENTOLIN HFA) 108 (90 Base) MCG/ACT inhaler Ventolin HFA 90 mcg/actuation aerosol inhaler  INL 2 PFS PO Q 4 H PRF WHZ OR SOB (Patient not taking: Reported on 09/11/2021)     benzonatate (TESSALON) 100 MG capsule Take 100 mg by mouth 3 (three) times daily as needed for cough. (Patient not taking: Reported on 09/11/2021)     Ibuprofen-Famotidine (DUEXIS PO) Take 2 tablets by mouth daily. (Patient not taking: Reported on 09/11/2021)     No facility-administered medications prior to visit.     Allergies:   Latex   Social History   Socioeconomic History   Marital status: Married    Spouse name: Not on file   Number of children: Not on file   Years of education: Not on file   Highest education level: Not on file  Occupational History   Not on file  Tobacco Use   Smoking status: Former    Years: 0.50    Types: Cigarettes    Quit date: 06/27/1978    Years since quitting: 43.2   Smokeless tobacco: Never  Substance and Sexual Activity   Alcohol use: Yes    Alcohol/week: 1.0 standard drink of alcohol    Types: 1 Standard drinks or equivalent per week    Comment: social   Drug use: No   Sexual activity: Not on file  Other Topics Concern   Not on file  Social History Narrative   Not on file   Social Determinants of Health   Financial Resource Strain: Not on file  Food Insecurity: Not on file  Transportation Needs: Not on file  Physical Activity: Not on file  Stress: Not on file  Social Connections: Not on file    She works for [employer].  Family History:  The patient's family history includes Breast cancer in her maternal grandmother, mother, and paternal grandmother; COPD in her maternal grandmother; Cancer - Colon in her maternal grandfather, maternal grandmother, and mother; Diabetes in her father; Heart disease  in her father, paternal grandfather, and paternal grandmother; Hyperlipidemia in her father; Hypertension in her father and paternal grandmother; Uterine cancer in her mother.  Her father is 75 and had undergone CABG revascularization.  Mother died at age 81 with heart failure and possible colon cancer.  She has 2 sisters ages 23 and 24 and 81 brother age 30.  ROS General: Negative; No fevers, chills, or night sweats; obesity HEENT: Negative; No changes in vision or hearing, sinus congestion, difficulty swallowing Pulmonary: Negative; No cough, wheezing, shortness of breath, hemoptysis Cardiovascular: See HPI GI: GERD GU: Negative;  No dysuria, hematuria, or difficulty voiding Musculoskeletal: Negative; no myalgias, joint pain, or weakness Hematologic/Oncology: Negative; no easy bruising, bleeding Endocrine: Negative; no heat/cold intolerance; no diabetes Neuro: Negative; no changes in balance, headaches Skin: Negative; No rashes or skin lesions Psychiatric: Negative; No behavioral problems, depression Sleep: Negative; No snoring, daytime sleepiness, hypersomnolence, bruxism, restless legs, hypnogognic hallucinations, no cataplexy Other comprehensive 14 point system review is negative.   PHYSICAL EXAM:   VS:  BP 120/80 (BP Location: Left Arm)   Pulse 65   Ht 5\' 11"  (1.803 m)   Wt 270 lb 3.2 oz (122.6 kg)   LMP 03/14/2011   SpO2 97%   BMI 37.69 kg/m     Repeat blood pressure by me was 126/80.  Wt Readings from Last 3 Encounters:  09/11/21 270 lb 3.2 oz (122.6 kg)  07/26/16 279 lb (126.6 kg)  06/30/15 263 lb 8 oz (119.5 kg)    General: Alert, oriented, no distress.  Class II obesity Skin: normal turgor, no rashes, warm and dry HEENT: Normocephalic, atraumatic. Pupils equal round and reactive to light; sclera anicteric; extraocular muscles intact;  Nose without nasal septal hypertrophy Mouth/Parynx benign; Mallinpatti scale 3 Neck: No JVD, no carotid bruits; normal carotid  upstroke Lungs: clear to ausculatation and percussion; no wheezing or rales Chest wall: without tenderness to palpitation Heart: PMI not displaced, RRR, s1 s2 normal, 1/6 systolic murmur, no diastolic murmur, no rubs, gallops, thrills, or heaves Abdomen: soft, nontender; no hepatosplenomehaly, BS+; abdominal aorta nontender and not dilated by palpation. Back: no CVA tenderness Pulses 2+ Musculoskeletal: full range of motion, normal strength, no joint deformities Extremities: Left lower extremity venous stasis changes, no clubbing cyanosis, Homan's sign negative  Neurologic: grossly nonfocal; Cranial nerves grossly wnl Psychologic: Normal mood and affect   Studies/Labs Reviewed:   September 11, 2021 ECG (independently read by me):  NSR at 65; baseline artifact, no significant STT change; QTc 430 msec  Recent Labs:    Latest Ref Rng & Units 01/05/2013    8:28 AM 12/17/2012    8:45 AM  BMP  Glucose 70 - 99 mg/dL 90  105   BUN 6 - 23 mg/dL 18  14   Creatinine 0.50 - 1.10 mg/dL 0.69  0.75   Sodium 135 - 145 mEq/L 139  137   Potassium 3.5 - 5.3 mEq/L 4.4  3.6   Chloride 96 - 112 mEq/L 104  100   CO2 19 - 32 mEq/L 27  27   Calcium 8.4 - 10.5 mg/dL 8.7  9.8          No data to display             Latest Ref Rng & Units 06/30/2015    6:35 AM 12/17/2012    8:45 AM  CBC  WBC 4.0 - 10.5 K/uL  7.5   Hemoglobin 12.0 - 15.0 g/dL 11.5  12.0   Hematocrit 36.0 - 46.0 %  37.4   Platelets 150 - 400 K/uL  315    Lab Results  Component Value Date   MCV 82.7 12/17/2012   Lab Results  Component Value Date   TSH 4.754 (H) 01/05/2013   No results found for: "HGBA1C"   BNP No results found for: "BNP"  ProBNP    Component Value Date/Time   PROBNP 22.8 12/17/2012 0845     Lipid Panel     Component Value Date/Time   CHOL 188 01/05/2013 0828   TRIG 136 01/05/2013 0828   HDL  49 01/05/2013 0828   LDLCALC 112 (H) 01/05/2013 [id number]     RADIOLOGY: No results  found.   Additional studies/ records that were reviewed today include:  I reviewed my previous evaluation with last assessment in March 2017.  Records from [place], PA-C was reviewed  ASSESSMENT:    1. Atypical chest pain   2. Tachycardia   3. Obesity (BMI 35.0-39.9 without comorbidity)   4. Family history of premature CAD   5. Snoring   6. Nocturia   7. Awakens from sleep at night   8. Venous stasis of lower extremity   9. Gastroesophageal reflux disease without esophagitis     PLAN:  Melinda Reeves is a 61 year old female who has a history of intermittent palpitations and in the past had experienced left-sided chest pain associated with mild shortness of breath.  A CT angiogram in 2014 was negative for PE.  A nuclear perfusion study revealed normal perfusion and function without scar or ischemia.  Stress EF was 69% and walked 6 minutes and 45 seconds on a treadmill without chest pain or ECG changes.  Her echo Doppler study of January 19, 2013 showed an EF of 55 to 60% with mild mitral annular calcification and trivial MR.  She had normal diastolic function.  She has a family history for premature CAD in her father undergoing CABG revascularization surgery and also has documented abdominal aortic aneurysm.  Recently she has experienced some nonexertional chest pain when sitting radiating to her left arm.  She denies exertional chest pain particularly when she cuts the grass or is walking.  She has left leg discomfort and admits to occasional leg cramps.  She has issues with GERD for which she takes pantoprazole.  Recently she has noticed in the morning her heart races and heart rate can range from 70-1 14 but typically runs in the 90s.  She admits to suboptimal sleep and that she snores experiences nocturia several times per night notes nonrestorative sleep and is postmenopausal.  Presently I am recommending she undergo a 2D echo Doppler study for reassessment of LV  systolic diastolic function in addition to valvular architecture.  For reassessment of potential CAD I will schedule her for a coronary calcium score.  She apparently saw her primary provider today and had undergone laboratory.  I will try to obtain these results.  I am also recommending she undergo a sleep study for assessment of possible obstructive sleep apnea which may be contributory to her increasing heart rate in the morning.  She has venous stasis changes of her left lower extremity.  Bevelyn Buckles' sign is negative and there is no significant edema.  I will see her back in follow-up of the above studies and further recommendations were made at that time.   Medication Adjustments/Labs and Tests Ordered: Current medicines are reviewed at length with the patient today.  Concerns regarding medicines are outlined above.  Medication changes, Labs and Tests ordered today are listed in the Patient Instructions below. Patient Instructions  Medication Instructions:    *If you need a refill on your cardiac medications before your next appointment, please call your pharmacy*   Lab Work:  If you have labs (blood work) drawn today and your tests are completely normal, you will receive your results only by: [place] (if you have MyChart) OR A paper copy in the mail If you have any lab test that is abnormal or we need to change your treatment, we will call  you to review the results.   Testing/Procedures: Will be schedule at [place] has requested that you have an echocardiogram. Echocardiography is a painless test that uses sound waves to create images of your heart. It provides your doctor with information about the size and shape of your heart and how well your heart's chambers and valves are working. This procedure takes approximately one hour. There are no restrictions for this procedure.  And  You will pick up machine from [place]  center   Your physician has recommended that you have a  home sleep study. This test records several body functions during sleep, including: brain activity, eye movement, oxygen and carbon dioxide blood levels, heart rate and rhythm, breathing rate and rhythm, the flow of air through your mouth and nose, snoring, body muscle movements, and chest and belly movement.  And   CT coronary calcium score.   Test locations:  [facility] ([address] [place], [place] [address]) [facility] ([address] [place], [place] [address])   This is $99 out of pocket.   Coronary CalciumScan A coronary calcium scan is an imaging test used to look for deposits of calcium and other fatty materials (plaques) in the inner lining of the blood vessels of the heart (coronary arteries). These deposits of calcium and plaques can partly clog and narrow the coronary arteries without producing any symptoms or warning signs. This puts a person at risk for a heart attack. This test can detect these deposits before symptoms develop. Tell a health care provider about: Any allergies you have. All medicines you are taking, including vitamins, herbs, eye drops, creams, and over-the-counter medicines. Any problems you or family members have had with anesthetic medicines. Any blood disorders you have. Any surgeries you have had. Any medical conditions you have. Whether you are pregnant or may be pregnant. What are the risks? Generally, this is a safe procedure. However, problems may occur, including: Harm to a pregnant woman and her unborn baby. This test involves the use of radiation. Radiation exposure can be dangerous to a pregnant woman and her unborn baby. If you are pregnant, you generally should not have this procedure done. Slight increase in the risk of cancer. This is because of the radiation involved in the test. What happens before the procedure? No preparation is needed for this procedure. What  happens during the procedure? You will undress and remove any jewelry around your neck or chest. You will put on a hospital gown. Sticky electrodes will be placed on your chest. The electrodes will be connected to an electrocardiogram (ECG) machine to record a tracing of the electrical activity of your heart. A CT scanner will take pictures of your heart. During this time, you will be asked to lie still and hold your breath for 2-3 seconds while a picture of your heart is being taken. The procedure may vary among health care providers and hospitals. What happens after the procedure? You can get dressed. You can return to your normal activities. It is up to you to get the results of your test. Ask your health care provider, or the department that is doing the test, when your results will be ready. Summary A coronary calcium scan is an imaging test used to look for deposits of calcium and other fatty materials (plaques) in the inner lining of the blood vessels of the heart (coronary arteries). Generally, this is a safe procedure. Tell  your health care provider if you are pregnant or may be pregnant. No preparation is needed for this procedure. A CT scanner will take pictures of your heart. You can return to your normal activities after the scan is done. This information is not intended to replace advice given to you by your health care provider. Make sure you discuss any questions you have with your health care provider. Document Released: 09/01/2007 Document Revised: 01/23/2016 Document Reviewed: 01/23/2016 Elsevier Interactive Patient Education  2017 [employer].  Follow-Up: At [facility], you and your health needs are our priority.  As part of our continuing mission to provide you with exceptional heart care, we have created designated Provider Care Teams.  These Care Teams include your primary Cardiologist (physician) and Advanced Practice Providers (APPs -  Physician Assistants and Nurse  Practitioners) who all work together to provide you with the care you need, when you need it.     Your next appointment:   2 month(s)  The format for your next appointment:   In Person  Provider:   Nicki Guadalajara, Reeves    Other Instructions   Recommend   not  to  take  Phentermine   Signed, Melinda Guadalajara, Reeves  09/15/2021 8:28 PM    [facility] [address], [address], [place], [place]  [address] Phone: [phone]

## 2021-09-14 ENCOUNTER — Other Ambulatory Visit (HOSPITAL_BASED_OUTPATIENT_CLINIC_OR_DEPARTMENT_OTHER): Payer: Commercial Managed Care - PPO

## 2021-09-15 ENCOUNTER — Encounter: Payer: Self-pay | Admitting: Cardiovascular Disease

## 2021-09-15 ENCOUNTER — Encounter (INDEPENDENT_AMBULATORY_CARE_PROVIDER_SITE_OTHER): Payer: Self-pay

## 2021-09-22 ENCOUNTER — Ambulatory Visit (HOSPITAL_BASED_OUTPATIENT_CLINIC_OR_DEPARTMENT_OTHER)
Admission: RE | Admit: 2021-09-22 | Discharge: 2021-09-22 | Disposition: A | Discharge status: Home / Self Care | Payer: Commercial Managed Care - PPO | Source: Ambulatory Visit | Attending: Cardiovascular Disease | Admitting: Cardiovascular Disease

## 2021-09-22 DIAGNOSIS — R0683 Snoring: Secondary | ICD-10-CM

## 2021-09-22 DIAGNOSIS — E669 Obesity, unspecified: Secondary | ICD-10-CM

## 2021-09-22 DIAGNOSIS — G478 Other sleep disorders: Secondary | ICD-10-CM

## 2021-09-22 DIAGNOSIS — R351 Nocturia: Secondary | ICD-10-CM

## 2021-09-22 DIAGNOSIS — Z8249 Family history of ischemic heart disease and other diseases of the circulatory system: Secondary | ICD-10-CM

## 2021-09-25 ENCOUNTER — Ambulatory Visit (HOSPITAL_COMMUNITY): Payer: Commercial Managed Care - PPO | Attending: Cardiology

## 2021-09-25 DIAGNOSIS — R351 Nocturia: Secondary | ICD-10-CM | POA: Insufficient documentation

## 2021-09-25 DIAGNOSIS — E669 Obesity, unspecified: Secondary | ICD-10-CM | POA: Insufficient documentation

## 2021-09-25 DIAGNOSIS — G478 Other sleep disorders: Secondary | ICD-10-CM | POA: Diagnosis present

## 2021-09-25 DIAGNOSIS — Z8249 Family history of ischemic heart disease and other diseases of the circulatory system: Secondary | ICD-10-CM | POA: Insufficient documentation

## 2021-09-25 DIAGNOSIS — I351 Nonrheumatic aortic (valve) insufficiency: Secondary | ICD-10-CM | POA: Diagnosis not present

## 2021-09-25 DIAGNOSIS — R0683 Snoring: Secondary | ICD-10-CM | POA: Insufficient documentation

## 2021-09-25 LAB — ECHOCARDIOGRAM COMPLETE
Area-P 1/2: 3.95 cm2
P 1/2 time: 672 msec
S' Lateral: 2.8 cm

## 2021-11-07 ENCOUNTER — Ambulatory Visit (HOSPITAL_BASED_OUTPATIENT_CLINIC_OR_DEPARTMENT_OTHER): Payer: Commercial Managed Care - PPO | Attending: Cardiovascular Disease | Admitting: Cardiovascular Disease

## 2021-11-07 DIAGNOSIS — G4733 Obstructive sleep apnea (adult) (pediatric): Secondary | ICD-10-CM | POA: Diagnosis present

## 2021-11-07 DIAGNOSIS — Z8249 Family history of ischemic heart disease and other diseases of the circulatory system: Secondary | ICD-10-CM

## 2021-11-07 DIAGNOSIS — Z6835 Body mass index (BMI) 35.0-35.9, adult: Secondary | ICD-10-CM | POA: Insufficient documentation

## 2021-11-07 DIAGNOSIS — R351 Nocturia: Secondary | ICD-10-CM | POA: Diagnosis not present

## 2021-11-07 DIAGNOSIS — G478 Other sleep disorders: Secondary | ICD-10-CM

## 2021-11-07 DIAGNOSIS — R0683 Snoring: Secondary | ICD-10-CM

## 2021-11-07 DIAGNOSIS — E669 Obesity, unspecified: Secondary | ICD-10-CM | POA: Diagnosis not present

## 2021-11-08 ENCOUNTER — Ambulatory Visit: Payer: Commercial Managed Care - PPO | Admitting: Cardiovascular Disease

## 2021-11-08 ENCOUNTER — Encounter (HOSPITAL_BASED_OUTPATIENT_CLINIC_OR_DEPARTMENT_OTHER): Payer: Self-pay | Admitting: Cardiovascular Disease

## 2021-11-08 ENCOUNTER — Encounter: Payer: Self-pay | Admitting: Cardiovascular Disease

## 2021-11-08 DIAGNOSIS — E669 Obesity, unspecified: Secondary | ICD-10-CM | POA: Diagnosis not present

## 2021-11-08 DIAGNOSIS — Z8249 Family history of ischemic heart disease and other diseases of the circulatory system: Secondary | ICD-10-CM | POA: Diagnosis not present

## 2021-11-08 DIAGNOSIS — R0789 Other chest pain: Secondary | ICD-10-CM | POA: Diagnosis not present

## 2021-11-08 DIAGNOSIS — G4733 Obstructive sleep apnea (adult) (pediatric): Secondary | ICD-10-CM | POA: Diagnosis not present

## 2021-11-08 DIAGNOSIS — I878 Other specified disorders of veins: Secondary | ICD-10-CM

## 2021-11-08 DIAGNOSIS — R000 Tachycardia, unspecified: Secondary | ICD-10-CM

## 2021-11-08 NOTE — Progress Notes (Addendum)
Cardiology Office Note    Date:  11/08/2021   ID:  Melinda Reeves, DOB [birth year], MRN [id number]  PCP:  Verlee Rossetti, PA-C  Cardiologist:  Nicki Guadalajara, MD   2 month F/U evaluation  History of Present Illness:  Melinda Reeves is a 61 y.o. female who is followed by Edger House, PA-C at [facility].  I had seen her remotely and last saw her in March 2017.  She reestablished cardiology care with me on September 11, 2021 and presents for a 2 month follow-up evaluation.  Melinda Reeves was initially seen by me in 2014 after she had experienced episodes of left-sided chest pain associated with mild episodes of shortness of breath.  She has a strong family history for premature CAD in her father who underwent CABG revascularization surgery and has a documented abdominal aortic aneurysm.  When seen in 2014, she had presented to the emergency room for chest discomfort.  Her ECG was unremarkable.  A CT angiogram was done due to right-sided pain which was negative for PE.  Cardiac enzymes were negative.  When I initially saw her, I recommended she undergo a 2D echo Doppler study as well as nuclear perfusion scan.  Her nuclear study done on January 07, 2013 showed normal perfusion and function without evidence for scar or ischemia.  A 2D echo Doppler study showed EF 55 to 65% with mild mitral annular calcification and normal diastolic function.  She has a history of mild asthma and is taking Symbicort and as needed albuterol.  I had seen her in 2017 for preoperative evaluation prior to undergoing left knee arthroscopic surgery by Dr. Linna Caprice.  I had not seen her since 2017.  She is now followed at [facility] at [place].  I saw her on September 11, 2021 which time she admitted that her heart rate would race when she would lay down.  She has a history of GERD and takes pantoprazole.  She admits to being under increased stress.  She also experiences a stabbing pain in her left leg.  She  admits to being active and cuts grass.  Oftentimes in the morning her heart rate can range from 70 to 114 bpm.  She also experiences some nonexertional chest pain while sitting which radiates to her left arm.  Upon further questioning, she does not sleep well and admits to snoring, nocturia 2 times per night and is postmenopausal.  For that evaluation, recommended that she undergo a 2D echo Doppler study for reassessment of LV systolic and diastolic function in addition to valvular architecture.  For assessment of potential CAD I recommended she undergo a coronary calcium score.  Due to concerns for obstructive sleep apnea I also recommended she undergo a home sleep study.  He had venous stasis changes to her left lower extremity.  Denna Haggard' sign was negative and there was no edema.  Her echo Doppler study was done on September 25, 2021 which showed an EF of 55 to 60%.  She had normal diastolic parameters and normal strain.  There was mild left atrial dilatation.  Mild calcification of her aortic valve with mild aortic regurgitation and no aortic stenosis.  There was trivial MR.  CT calcium score was excellent with a calcium score of 0.  Over read of her CT chest did not reveal any acute or significant extracardiac findings.  Today, she underwent home sleep study.  I was able to interpret today.  This revealed  mild overall sleep apnea with an AHI of 14.1/h.  Events were more significant with supine sleep with an AHI of 27.6/h.  O2 nadir was 76% and time spent less than 89% was 10.3 minutes.  Heart rate ranged from 64 to 118 bpm which was consistent with her prior concerns.  Only, she feels well.  She denies chest pain or shortness of breath.  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  She presents for reevaluation.   Past Medical History:  Diagnosis Date   Acute meniscal tear of left knee    Environmental and seasonal allergies    GERD (gastroesophageal reflux disease)     Hyperlipidemia    PONV (postoperative nausea and vomiting)    SUI (stress urinary incontinence, female)    Wears glasses     Past Surgical History:  Procedure Laterality Date   CARDIOVASCULAR STRESS TEST  01-07-2013   normal nuclear perfusion study w/ no ischemia/  normal LV function and wall motion, ef 69%   CESAREAN SECTION  1996   EXCISION LEFT BREAST MASS  07-28-2004   benign   KNEE ARTHROSCOPY WITH LATERAL MENISECTOMY Left 06/30/2015   Procedure: LEFT KNEE ARTHROSCOPY WITH PARTIAL LATERAL MENISECTOMY;  Surgeon: Samson Frederic, MD;  Location: [facility];  Service: Orthopedics;  Laterality: Left;   PILONIDAL CYST EXCISION  1990   TRANSTHORACIC ECHOCARDIOGRAM  01-19-2013   normal LVF, ef 55-65%/  trivial MR and TR/  mild LAE    Current Medications: Outpatient Medications Prior to Visit  Medication Sig Dispense Refill   atorvastatin (LIPITOR) 10 MG tablet Take 10 mg by mouth daily.     buPROPion (WELLBUTRIN XL) 150 MG 24 hr tablet Take 150 mg by mouth daily.     Cholecalciferol (VITAMIN D3) 1.25 MG (50000 UT) CAPS Take by mouth.     levothyroxine (SYNTHROID) 25 MCG tablet Take 25 mcg by mouth daily. Takes half (1/2) tablet daily     loratadine (CLARITIN) 10 MG tablet Take 1 tablet by mouth daily.     pantoprazole (PROTONIX) 40 MG tablet Take 40 mg by mouth daily.     valACYclovir (VALTREX) 1000 MG tablet Take 1,000 mg by mouth as needed (for fever blisters).     phentermine (ADIPEX-P) 37.5 MG tablet Take 37.5 mg by mouth daily. Takes half (1/2) a tablet daily     No facility-administered medications prior to visit.     Allergies:   Latex   Social History   Socioeconomic History   Marital status: Married    Spouse name: Not on file   Number of children: Not on file   Years of education: Not on file   Highest education level: Not on file  Occupational History   Not on file  Tobacco Use   Smoking status: Former    Years: 0.50    Types: Cigarettes     Quit date: 06/27/1978    Years since quitting: 43.3   Smokeless tobacco: Never  Substance and Sexual Activity   Alcohol use: Yes    Alcohol/week: 1.0 standard drink of alcohol    Types: 1 Standard drinks or equivalent per week    Comment: social   Drug use: No   Sexual activity: Not on file  Other Topics Concern   Not on file  Social History Narrative   Not on file   Social Determinants of Health   Financial Resource Strain: Not on file  Food Insecurity: Not on  file  Transportation Needs: Not on file  Physical Activity: Not on file  Stress: Not on file  Social Connections: Not on file    She works for [employer].  Family History:  The patient's family history includes Breast cancer in her maternal grandmother, mother, and paternal grandmother; COPD in her maternal grandmother; Cancer - Colon in her maternal grandfather, maternal grandmother, and mother; Diabetes in her father; Heart disease in her father, paternal grandfather, and paternal grandmother; Hyperlipidemia in her father; Hypertension in her father and paternal grandmother; Uterine cancer in her mother.   Her father is 25 and had undergone CABG revascularization.  Mother died at age 49 with heart failure and possible colon cancer.  She has 2 sisters ages 77 and 88 and 1 brother age 63.  ROS General: Negative; No fevers, chills, or night sweats; obesity HEENT: Negative; No changes in vision or hearing, sinus congestion, difficulty swallowing Pulmonary: Negative; No cough, wheezing, shortness of breath, hemoptysis Cardiovascular: See HPI GI: GERD GU: Negative; No dysuria, hematuria, or difficulty voiding Musculoskeletal: Negative; no myalgias, joint pain, or weakness Hematologic/Oncology: Negative; no easy bruising, bleeding Endocrine: Negative; no heat/cold intolerance; no diabetes Neuro: Negative; no changes in balance, headaches Skin: Negative; No rashes or skin lesions Psychiatric: Negative; No  behavioral problems, depression Sleep: Positive for snoring, sleep study done November 07, 2021 is positive for obstructive sleep apnea.Other comprehensive 14 point system review is negative.   PHYSICAL EXAM:   VS:  BP 120/72   Pulse 71   Ht  (1.803 m)   Wt 265 lb 3.2 oz (120.3 kg)   LMP 03/14/2011   SpO2 98%   BMI 36.99 kg/m     Repeat blood pressure by me was 120/76  Wt Readings from Last 3 Encounters:  11/08/21 265 lb 3.2 oz (120.3 kg)  11/07/21 268 lb (121.6 kg)  09/11/21 270 lb 3.2 oz (122.6 kg)    General: Alert, oriented, no distress.  Skin: normal turgor, no rashes, warm and dry HEENT: Normocephalic, atraumatic. Pupils equal round and reactive to light; sclera anicteric; extraocular muscles intact;  Nose without nasal septal hypertrophy Mouth/Parynx benign; Mallinpatti scale 3 Neck: No JVD, no carotid bruits; normal carotid upstroke Lungs: clear to ausculatation and percussion; no wheezing or rales Chest wall: without tenderness to palpitation Heart: PMI not displaced, RRR, s1 s2 normal, 1/6 systolic murmur, no diastolic murmur, no rubs, gallops, thrills, or heaves Abdomen: soft, nontender; no hepatosplenomehaly, BS+; abdominal aorta nontender and not dilated by palpation. Back: no CVA tenderness Pulses 2+ Musculoskeletal: full range of motion, normal strength, no joint deformities Extremities: Left lower extremity venous stasis changes.  Negative Homans' sign.  No clubbing cyanosis or edema,  Neurologic: grossly nonfocal; Cranial nerves grossly wnl Psychologic: Normal mood and affect   Studies/Labs Reviewed:   November 08, 2021 ECG (independently read by me): NSR at 71, no ectopy  September 11, 2021 ECG (independently read by me):  NSR at 65; baseline artifact, no significant STT change; QTc 430 msec  Recent Labs:    Latest Ref Rng & Units 01/05/2013    8:28 AM 12/17/2012    8:45 AM  BMP  Glucose 70 - 99 mg/dL 90  960   BUN 6 - 23 mg/dL 18  14   Creatinine  4.54 - 1.10 mg/dL 0.98  1.19   Sodium 147 - 145 mEq/L 139  137   Potassium 3.5 - 5.3 mEq/L 4.4  3.6   Chloride 96 - 112 mEq/L  104  100   CO2 19 - 32 mEq/L 27  27   Calcium 8.4 - 10.5 mg/dL 8.7  9.8          No data to display             Latest Ref Rng & Units 06/30/2015    6:35 AM 12/17/2012    8:45 AM  CBC  WBC 4.0 - 10.5 K/uL  7.5   Hemoglobin 12.0 - 15.0 g/dL 83.3  82.5   Hematocrit 36.0 - 46.0 %  37.4   Platelets 150 - 400 K/uL  315    Lab Results  Component Value Date   MCV 82.7 12/17/2012   Lab Results  Component Value Date   TSH 4.754 (H) 01/05/2013   No results found for: "HGBA1C"   BNP No results found for: "BNP"  ProBNP    Component Value Date/Time   PROBNP 22.8 12/17/2012 0845     Lipid Panel     Component Value Date/Time   CHOL 188 01/05/2013 0828   TRIG 136 01/05/2013 0828   HDL 49 01/05/2013 0828   LDLCALC 112 (H) 01/05/2013 0828     RADIOLOGY: No results found.   Additional studies/ records that were reviewed today include:  I reviewed my previous evaluation with last assessment in March 2017.  Records from [facility] Edger House, PA-C was reviewed  ECHO: 09/25/2021  1. Left ventricular ejection fraction, by estimation, is 55 to 60%. The  left ventricle has normal function. The left ventricle has no regional  wall motion abnormalities. Left ventricular diastolic parameters were  normal. The average left ventricular  global longitudinal strain is -19.1 %. The global longitudinal strain is  normal.   2. Right ventricular systolic function is normal. The right ventricular  size is normal. There is normal pulmonary artery systolic pressure.   3. Left atrial size was mildly dilated.   4. The mitral valve is normal in structure. Trivial mitral valve  regurgitation. No evidence of mitral stenosis.   5. The aortic valve is tricuspid. There is mild calcification of the  aortic valve. There is mild thickening of the aortic  valve. Aortic valve  regurgitation is mild. No aortic stenosis is present.   6. The inferior vena cava is normal in size with greater than 50%  respiratory variability, suggesting right atrial pressure of 3 mmHg.   Comparison(s): No significant change from prior study.   CT CARDIAC SCORE: 09/25/2021 FINDINGS: Coronary Calcium Score:   Left main: 0   Left anterior descending artery: 0   Left circumflex artery: 0   Right coronary artery: 0   Total: 0   Percentile: NA   Pericardium: Normal.   Non-cardiac: See separate report from [facility].   IMPRESSION: Coronary calcium score of 0.   OVER-READ CT CHEST FINDINGS:   Cardiovascular: See dedicated report for cardiovascular details.   Mediastinum/Nodes: No thoracic inlet, axillary, mediastinal or hilar adenopathy. Esophagus grossly normal.   Lungs/Pleura: No acute process or adenopathy in visualized portions of the mediastinum.   Upper Abdomen: Lungs are clear and visualized airways are patent.   Musculoskeletal: Incidental imaging of upper abdominal contents without acute process.   IMPRESSION:   No acute or significant extracardiac findings.  -----------------------------------------------------------------------------------------------------   Patient Name: Melinda Reeves, Melinda Reeves Date: 11/07/2021 Gender: Female D.O.B: 04-30-1960 Age (years): 81 Referring Provider: Nicki Guadalajara MD, ABSM Height (inches): 71 Interpreting Physician: Nicki Guadalajara MD, ABSM Weight (lbs): 268 RPSGT: [place] BMI:  37 MRN: [id number] Neck Size: 15.00   CLINICAL INFORMATION Sleep Study Type: HST   Indication for sleep study: snoring, difficulty sleeping on back, non-rewstorative sleep, nocturnal palpitations   Epworth Sleepiness Score: 5   SLEEP STUDY TECHNIQUE A multi-channel overnight portable sleep study was performed. The channels recorded were: nasal airflow, thoracic respiratory movement, and oxygen  saturation with a pulse oximetry. Snoring was also monitored.   MEDICATIONS atorvastatin (LIPITOR) 10 MG tablet buPROPion (WELLBUTRIN XL) 150 MG 24 hr tablet Cholecalciferol (VITAMIN D3) 1.25 MG (50000 UT) CAPS levothyroxine (SYNTHROID) 25 MCG tablet loratadine (CLARITIN) 10 MG tablet pantoprazole (PROTONIX) 40 MG tablet valACYclovir (VALTREX) 1000 MG tablet Patient self administered medications include: N/A.   SLEEP ARCHITECTURE Patient was studied for 369.5 minutes. The sleep efficiency was 100.0 % and the patient was supine for 0%. The arousal index was 0.0 per hour.   RESPIRATORY PARAMETERS The overall AHI was 14.1 per hour, with a central apnea index of 0 per hour. There was a significant positional component with supine sleep 27.6/h versus non-supine sleep at 2.0/h.    The oxygen nadir was 76% during sleep.   CARDIAC DATA Mean heart rate during sleep was 80.2 bpm.   IMPRESSIONS - Mild obstructive sleep apnea occurred during this study (AHI 14.1/h) with moderate sleep apnea during supine sleep (AHI 27.6/h). - Severe oxygen desaturation was noted during this study (Min O2 76%). - Minimal snoring was audible during this study. DIAGNOSIS - Obstructive Sleep Apnea (G47.33) - Nocturnal Hypoxemia (G47.36) RECOMMENDATIONS - Therapeutic CPAP titration to determine optimal pressure required to alleviate sleep disordered breathing. Recommend initiation of Auto-PAP with EPR of 3 at 6 - 16 cm of water.  - Effort should be made to optimize nasal nads oropharyngeal patency. - Positional therapy avoiding supine position during sleep. - A customized oral appliance may be considered if patient is against CPAP initiation. - Avoid alcohol, sedatives and other CNS depressants that may worsen sleep apnea and disrupt normal sleep architecture. - Sleep hygiene should be reviewed to assess factors that may improve sleep quality. - Weight management (BMI 37) and regular exercise should be initiated  or continued. - Recommend a download and sleep clinic evaluation after 4-6 weeks of therapy.      ASSESSMENT:    1. Atypical chest pain   2. Family history of premature CAD   3. OSA (obstructive sleep apnea)   4. Obesity (BMI 35.0-39.9 without comorbidity)   5. Tachycardia   6. Venous stasis of lower extremity     PLAN:  Melinda Reeves is a 61 year old female who has a history of intermittent palpitations and in the past had experienced left-sided chest pain associated with mild shortness of breath.  A CT angiogram in 2014 was negative for PE.  A nuclear perfusion study revealed normal perfusion and function without scar or ischemia.  Stress EF was 69% and walked 6 minutes and 45 seconds on a treadmill without chest pain or ECG changes.  Her echo Doppler study of January 19, 2013 showed an EF of 55 to 60% with mild mitral annular calcification and trivial MR.  She had normal diastolic function.  She has a family history for premature CAD in her father undergoing CABG revascularization surgery and also has documented abdominal aortic aneurysm.  Had a recent evaluation in June 2023 he had experienced some nonexertional chest pain while sitting radiating to her left arm and denied any exertional symptomatology.  She had noticed her heart rate ranging from 70  to 114 bpm with heart rates increasing at night.  She had suboptimal sleep and I was concerned of potential obstructive sleep apnea contributing to some of her symptomatology.  Her echo Doppler study verified normal systolic and diastolic function with normal valvular architecture.  A coronary calcium score was performed and this revealed a calcium score of 0.  Chest CT over read was normal.  Last evening she had a home sleep study a return the study to the lab.  I was able to interpret her study which confirms overall mild to moderate sleep apnea with an AHI of 14.1/h.  Events were most prominent with supine sleep with an AHI of 27.6/h minimal  with nonsupine sleep at 2 events per hour.  She had significant oxygen desaturation to a nadir of 76%.  Reviewed her sleep study with her today in detail.  During her study heart rate ranged from 64 to 118 bpm.  She admits to a 5 pound weight loss since her re-evaluation with me several months ago.  I will send her sleep study to our sleep coordinator and arrange for DME based on her insurance to initiate CPAP auto therapy with a range of 6 to 18 cm of water.  I discussed avoidance of supine sleep if at all possible.  Blood pressure today is stable on therapy she continues to take bupropion and for anxiety, levothyroxine 25 mcg for hypothyroidism and pantoprazole for GERD.  He is on low-dose atorvastatin for hyperlipidemia.  I anticipate it may take at least a month for her to get a CPAP unit.  I will see her in approximately 3 months for reevaluation or sooner as needed.   Recently she has experienced some nonexertional chest pain when sitting radiating to her left arm.  She denies exertional chest pain particularly when she cuts the grass or is walking.  She has left leg discomfort and admits to occasional leg cramps.  She has issues with GERD for which she takes pantoprazole.  Recently she has noticed in the morning her heart races and heart rate can range from 70-1 14 but typically runs in the 90s.  She admits to suboptimal sleep and that she snores experiences nocturia several times per night notes nonrestorative sleep and is postmenopausal.  Presently I am recommending she undergo a 2D echo Doppler study for reassessment of LV systolic diastolic function in addition to valvular architecture.  For reassessment of potential CAD I will schedule her for a coronary calcium score.  She apparently saw her primary provider today and had undergone laboratory.  I will try to obtain these results.  I am also recommending she undergo a sleep study for assessment of possible obstructive sleep apnea which may be  contributory to her increasing heart rate in the morning.  She has venous stasis changes of her left lower extremity.  Denna Haggard' sign is negative and there is no significant edema.  I will see her back in follow-up of the above studies and further recommendations were made at that time.   Medication Adjustments/Labs and Tests Ordered: Current medicines are reviewed at length with the patient today.  Concerns regarding medicines are outlined above.  Medication changes, Labs and Tests ordered today are listed in the Patient Instructions below. Patient Instructions  Medication Instructions:  Your physician recommends that you continue on your current medications as directed. Please refer to the Current Medication list given to you today.  *If you need a refill on your cardiac medications before your next  appointment, please call your pharmacy*   Follow-Up: At [facility], you and your health needs are our priority.  As part of our continuing mission to provide you with exceptional heart care, we have created designated Provider Care Teams.  These Care Teams include your primary Cardiologist (physician) and Advanced Practice Providers (APPs -  Physician Assistants and Nurse Practitioners) who all work together to provide you with the care you need, when you need it.  We recommend signing up for the patient portal called "MyChart".  Sign up information is provided on this After Visit Summary.  MyChart is used to connect with patients for Virtual Visits (Telemedicine).  Patients are able to view lab/test results, encounter notes, upcoming appointments, etc.  Non-urgent messages can be sent to your provider as well.   To learn more about what you can do with MyChart, go to [email or web address].    Your next appointment:   3 month(s)  The format for your next appointment:   In Person  Provider:   Nicki Guadalajara, MD   Signed, Nicki Guadalajara, MD  11/08/2021 12:57 PM    [facility]  HeartCare [address], [place], [place]  [address] Phone: [phone]

## 2021-11-08 NOTE — Patient Instructions (Signed)
Medication Instructions:  Your physician recommends that you continue on your current medications as directed. Please refer to the Current Medication list given to you today.  *If you need a refill on your cardiac medications before your next appointment, please call your pharmacy*   Follow-Up: At [facility], you and your health needs are our priority.  As part of our continuing mission to provide you with exceptional heart care, we have created designated Provider Care Teams.  These Care Teams include your primary Cardiologist (physician) and Advanced Practice Providers (APPs -  Physician Assistants and Nurse Practitioners) who all work together to provide you with the care you need, when you need it.  We recommend signing up for the patient portal called "MyChart".  Sign up information is provided on this After Visit Summary.  MyChart is used to connect with patients for Virtual Visits (Telemedicine).  Patients are able to view lab/test results, encounter notes, upcoming appointments, etc.  Non-urgent messages can be sent to your provider as well.   To learn more about what you can do with MyChart, go to [email or web address].    Your next appointment:   3 month(s)  The format for your next appointment:   In Person  Provider:   Nicki Guadalajara, MD

## 2021-11-08 NOTE — Procedures (Signed)
        Patient Name: Melinda Reeves, Melinda Reeves Date: 11/07/2021 Gender: Female D.O.B: Dec 03, 1960 Age (years): 59 Referring Provider: Nicki Guadalajara MD, ABSM Height (inches): 71 Interpreting Physician: Nicki Guadalajara MD, ABSM Weight (lbs): 268 RPSGT: [place] BMI: 37 MRN: [id number] Neck Size: 15.00  CLINICAL INFORMATION Sleep Study Type: HST  Indication for sleep study: snoring, difficulty sleeping on back, non-rewstorative sleep, nocturnal palpitations  Epworth Sleepiness Score: 5  SLEEP STUDY TECHNIQUE A multi-channel overnight portable sleep study was performed. The channels recorded were: nasal airflow, thoracic respiratory movement, and oxygen saturation with a pulse oximetry. Snoring was also monitored.  MEDICATIONS atorvastatin (LIPITOR) 10 MG tablet buPROPion (WELLBUTRIN XL) 150 MG 24 hr tablet Cholecalciferol (VITAMIN D3) 1.25 MG (50000 UT) CAPS levothyroxine (SYNTHROID) 25 MCG tablet loratadine (CLARITIN) 10 MG tablet pantoprazole (PROTONIX) 40 MG tablet valACYclovir (VALTREX) 1000 MG tablet Patient self administered medications include: N/A.  SLEEP ARCHITECTURE Patient was studied for 369.5 minutes. The sleep efficiency was 100.0 % and the patient was supine for 0%. The arousal index was 0.0 per hour.  RESPIRATORY PARAMETERS The overall AHI was 14.1 per hour, with a central apnea index of 0 per hour. There was a significant positional component with supine sleep 27.6/h versus non-supine sleep at 2.0/h.   The oxygen nadir was 76% during sleep.  CARDIAC DATA Mean heart rate during sleep was 80.2 bpm.  IMPRESSIONS - Mild obstructive sleep apnea occurred during this study (AHI 14.1/h) with moderate sleep apnea during supine sleep (AHI 27.6/h). - Severe oxygen desaturation was noted during this study (Min O2 76%). - Minimal snoring was audible during this study. DIAGNOSIS - Obstructive Sleep Apnea (G47.33) - Nocturnal Hypoxemia  (G47.36) RECOMMENDATIONS - Therapeutic CPAP titration to determine optimal pressure required to alleviate sleep disordered breathing. Recommend initiation of Auto-PAP with EPR of 3 at 6 - 16 cm of water.  - Effort should be made to optimize nasal nads oropharyngeal patency. - Positional therapy avoiding supine position during sleep. - A customized oral appliance may be considered if patient is against CPAP initiation. - Avoid alcohol, sedatives and other CNS depressants that may worsen sleep apnea and disrupt normal sleep architecture. - Sleep hygiene should be reviewed to assess factors that may improve sleep quality. - Weight management (BMI 37) and regular exercise should be initiated or continued. - Recommend a download and sleep clinic evaluation after 4-6 weeks of therapy.   [Electronically signed] 11/08/2021 12:37 PM  Nicki Guadalajara MD, [facility],  ABSM Diplomate, American Board of Sleep Medicine  NPI: [id number]   [facility] PH: [phone]   FX: [phone] ACCREDITED BY THE AMERICAN ACADEMY OF SLEEP MEDICINE

## 2021-11-14 ENCOUNTER — Telehealth: Payer: Self-pay | Admitting: *Deleted

## 2021-11-14 NOTE — Telephone Encounter (Signed)
-----   Message from Lennette Bihari, MD sent at 11/08/2021  1:03 PM EDT ----- Melinda Reeves please notify patient the results of her sleep study.  Initiate AutoPap with DME company with sleep clinic follow-up with me in 3 months.

## 2021-11-14 NOTE — Telephone Encounter (Signed)
Patient notified of HST results and recommendations. She agrees to proceed with APAP. Requested for order to be sent to [facility]. Orders submitted via [employer] portal.

## 2021-12-28 ENCOUNTER — Telehealth: Payer: Self-pay | Admitting: *Deleted

## 2021-12-28 NOTE — Telephone Encounter (Signed)
Staff message sent to [facility] 90 day compliance visit needed. Set up on CPAP 12/27/21.

## 2022-02-16 ENCOUNTER — Encounter: Payer: Self-pay | Admitting: Cardiovascular Disease

## 2022-02-16 ENCOUNTER — Ambulatory Visit: Payer: Commercial Managed Care - PPO | Attending: Cardiovascular Disease | Admitting: Cardiovascular Disease

## 2022-02-16 DIAGNOSIS — E669 Obesity, unspecified: Secondary | ICD-10-CM

## 2022-02-16 DIAGNOSIS — Z8249 Family history of ischemic heart disease and other diseases of the circulatory system: Secondary | ICD-10-CM | POA: Diagnosis not present

## 2022-02-16 DIAGNOSIS — G4733 Obstructive sleep apnea (adult) (pediatric): Secondary | ICD-10-CM

## 2022-02-16 DIAGNOSIS — R0789 Other chest pain: Secondary | ICD-10-CM | POA: Diagnosis not present

## 2022-02-16 DIAGNOSIS — K219 Gastro-esophageal reflux disease without esophagitis: Secondary | ICD-10-CM

## 2022-02-16 DIAGNOSIS — E039 Hypothyroidism, unspecified: Secondary | ICD-10-CM

## 2022-02-16 DIAGNOSIS — I878 Other specified disorders of veins: Secondary | ICD-10-CM

## 2022-02-16 NOTE — Patient Instructions (Signed)
Medication Instructions:  The current medical regimen is effective;  continue present plan and medications.  *If you need a refill on your cardiac medications before your next appointment, please call your pharmacy*   Lab Work: Fasting lab work (CBC, CMET, TSH, LIPID, LPa, A1c)  If you have labs (blood work) drawn today and your tests are completely normal, you will receive your results only by: MyChart Message (if you have MyChart) OR A paper copy in the mail If you have any lab test that is abnormal or we need to change your treatment, we will call you to review the results.  Follow-Up: At [facility], you and your health needs are our priority.  As part of our continuing mission to provide you with exceptional heart care, we have created designated Provider Care Teams.  These Care Teams include your primary Cardiologist (physician) and Advanced Practice Providers (APPs -  Physician Assistants and Nurse Practitioners) who all work together to provide you with the care you need, when you need it.  We recommend signing up for the patient portal called "MyChart".  Sign up information is provided on this After Visit Summary.  MyChart is used to connect with patients for Virtual Visits (Telemedicine).  Patients are able to view lab/test results, encounter notes, upcoming appointments, etc.  Non-urgent messages can be sent to your provider as well.   To learn more about what you can do with MyChart, go to [email or web address].    Your next appointment:   4 month(s)  The format for your next appointment:   In Person  Provider:   Nicki Guadalajara, MD

## 2022-02-16 NOTE — Progress Notes (Signed)
Cardiology Office Note    Date:  02/24/2022   ID:  Nimrit Kehres, DOB April 22, 1960, MRN [id number]  PCP:  Melinda Baptist, PA-C  Cardiologist:  Shelva Majestic, MD   4 month F/U evaluation  History of Present Illness:  Melinda Reeves is a 61 y.o. female who is followed by Melinda Derby, PA-C at [facility] at [facility].  I had seen her remotely and last saw her in March 2017.  She reestablished cardiology care with me on September 11, 2021.  I last saw her in August 2023.  She presents for 38-monthfollow-up cardiology/sleep evaluation.  Ms. DSweigertwas initially seen by me in 2014 after she had experienced episodes of left-sided chest pain associated with mild episodes of shortness of breath.  She has a strong family history for premature CAD in her father who underwent CABG revascularization surgery and has a documented abdominal aortic aneurysm.  When seen in 2014, she had presented to the emergency room for chest discomfort.  Her ECG was unremarkable.  A CT angiogram was done due to right-sided pain which was negative for PE.  Cardiac enzymes were negative.  When I initially saw her, I recommended she undergo a 2D echo Doppler study as well as nuclear perfusion scan.  Her nuclear study done on January 07, 2013 showed normal perfusion and function without evidence for scar or ischemia.  A 2D echo Doppler study showed EF 55 to 65% with mild mitral annular calcification and normal diastolic function.  She has a history of mild asthma and is taking Symbicort and as needed albuterol.  I had seen her in 2017 for preoperative evaluation prior to undergoing left knee arthroscopic surgery by Dr. SLyla Glassing  I had not seen her since 2017.  [facility]  I saw her on September 11, 2021 which time she admitted that her heart rate would race when she would lay down.  She has a history of GERD and takes pantoprazole.  She admits to being under increased stress.   She also experiences a stabbing pain in her left leg.  She admits to being active and cuts grass.  Oftentimes in the morning her heart rate can range from 70 to 114 bpm.  She also experiences some nonexertional chest pain while sitting which radiates to her left arm.  Upon further questioning, she does not sleep well and admits to snoring, nocturia 2 times per night and is postmenopausal.  For that evaluation, recommended that she undergo a 2D echo Doppler study for reassessment of LV systolic and diastolic function in addition to valvular architecture.  For assessment of potential CAD I recommended she undergo a coronary calcium score.  Due to concerns for obstructive sleep apnea I also recommended she undergo a home sleep study.  He had venous stasis changes to her left lower extremity.  HBevelyn Buckles sign was negative and there was no edema.  Her echo Doppler study was done on September 25, 2021 which showed an EF of 55 to 60%.  She had normal diastolic parameters and normal strain.  There was mild left atrial dilatation.  Mild calcification of her aortic valve with mild aortic regurgitation and no aortic stenosis.  There was trivial MR.  CT calcium score was excellent with a calcium score of 0.  Over read of her CT chest did not reveal any acute or significant extracardiac findings.  Today, she underwent  home sleep study.  I was able to interpret today.  This revealed mild overall sleep apnea with an AHI of 14.1/h.  Events were more significant with supine sleep with an AHI of 27.6/h.  O2 nadir was 76% and time spent less than 89% was 10.3 minutes.  Heart rate ranged from 64 to 118 bpm which was consistent with her prior concerns.  She denies chest pain or shortness of breath.  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    Since her last evaluation, CPAP was initiated with set up date on December 27, 2021 with [employer] as her DME company.  I obtained a download from October 11 through December 25, 2021 where she met compliance standards with 100% use and average usage 6 hours and 50 minutes.  Her pressure was set at a range of 6 to 16 cm of water.  AHI was 2.8.  However recently, she has been waking up at night and has been taking the mask off because she feels like she is suffocating.  An Epworth Sleepiness Scale score was calculated in the office today and this endorsed at 5 arguing against residual daytime sleepiness.  She continues to be on levothyroxine 25 mcg for hypothyroidism.  She is on low-dose atorvastatin 10 mg for hyperlipidemia.  She is on bupropion for anxiety/depression and pantoprazole for GERD.  She presents for follow-up evaluation.    Past Medical History:  Diagnosis Date   Acute meniscal tear of left knee    Environmental and seasonal allergies    GERD (gastroesophageal reflux disease)    Hyperlipidemia    PONV (postoperative nausea and vomiting)    SUI (stress urinary incontinence, female)    Wears glasses     Past Surgical History:  Procedure Laterality Date   CARDIOVASCULAR STRESS TEST  01-07-2013   normal nuclear perfusion study w/ no ischemia/  normal LV function and wall motion, ef 69%   CESAREAN SECTION  1996   EXCISION LEFT BREAST MASS  07-28-2004   benign   KNEE ARTHROSCOPY WITH LATERAL MENISECTOMY Left 06/30/2015   Procedure: LEFT KNEE ARTHROSCOPY WITH PARTIAL LATERAL MENISECTOMY;  Surgeon: Rod Can, MD;  Location: [facility];  Service: Orthopedics;  Laterality: Left;   PILONIDAL CYST EXCISION  1990   TRANSTHORACIC ECHOCARDIOGRAM  01-19-2013   normal LVF, ef 55-65%/  trivial MR and TR/  mild LAE    Current Medications: Outpatient Medications Prior to Visit  Medication Sig Dispense Refill   atorvastatin (LIPITOR) 10 MG tablet Take 10 mg by mouth daily.     buPROPion (WELLBUTRIN XL) 150 MG 24 hr tablet Take 150 mg by mouth daily.     Cholecalciferol (VITAMIN D3) 1.25 MG (50000 UT) CAPS Take by mouth.     levothyroxine  (SYNTHROID) 25 MCG tablet Take 25 mcg by mouth daily. Takes half (1/2) tablet daily     loratadine (CLARITIN) 10 MG tablet Take 1 tablet by mouth daily.     pantoprazole (PROTONIX) 40 MG tablet Take 40 mg by mouth daily.     valACYclovir (VALTREX) 1000 MG tablet Take 1,000 mg by mouth as needed (for fever blisters).     No facility-administered medications prior to visit.     Allergies:   Latex   Social History   Socioeconomic History   Marital status: Married    Spouse name: Not on file   Number of children: Not on file   Years of education: Not  on file   Highest education level: Not on file  Occupational History   Not on file  Tobacco Use   Smoking status: Former    Years: 0.50    Types: Cigarettes    Quit date: 06/27/1978    Years since quitting: 43.6   Smokeless tobacco: Never  Substance and Sexual Activity   Alcohol use: Yes    Alcohol/week: 1.0 standard drink of alcohol    Types: 1 Standard drinks or equivalent per week    Comment: social   Drug use: No   Sexual activity: Not on file  Other Topics Concern   Not on file  Social History Narrative   Not on file   Social Determinants of Health   Financial Resource Strain: Not on file  Food Insecurity: Not on file  Transportation Needs: Not on file  Physical Activity: Not on file  Stress: Not on file  Social Connections: Not on file    She works for [employer].  Family History:  The patient's family history includes Breast cancer in her maternal grandmother, mother, and paternal grandmother; COPD in her maternal grandmother; Cancer - Colon in her maternal grandfather, maternal grandmother, and mother; Diabetes in her father; Heart disease in her father, paternal grandfather, and paternal grandmother; Hyperlipidemia in her father; Hypertension in her father and paternal grandmother; Uterine cancer in her mother.   Her father is 20 and had undergone CABG revascularization.  Mother died at age 32  with heart failure and possible colon cancer.  She has 2 sisters ages 71 and 46 and 12 brother age 47.  ROS General: Negative; No fevers, chills, or night sweats; obesity HEENT: Negative; No changes in vision or hearing, sinus congestion, difficulty swallowing Pulmonary: Negative; No cough, wheezing, shortness of breath, hemoptysis Cardiovascular: See HPI GI: GERD GU: Negative; No dysuria, hematuria, or difficulty voiding Musculoskeletal: Negative; no myalgias, joint pain, or weakness Hematologic/Oncology: Negative; no easy bruising, bleeding Endocrine: Negative; no heat/cold intolerance; no diabetes Neuro: Negative; no changes in balance, headaches Skin: Negative; No rashes or skin lesions Psychiatric: Negative; No behavioral problems, depression Sleep: Positive for OSA, now on CPAP therapy with set up date December 27, 2021.  Other comprehensive 14 point system review is negative.   PHYSICAL EXAM:   VS:  BP 122/86 (BP Location: Left Arm, Patient Position: Sitting, Cuff Size: Large)   Pulse 62   Ht _0  (1.803 m)   Wt 279 lb 3.2 oz (126.6 kg)   LMP 03/14/2011   SpO2 99%   BMI 38.94 kg/m     Repeat blood pressure by me 128/80  Wt Readings from Last 3 Encounters:  02/16/22 279 lb 3.2 oz (126.6 kg)  11/08/21 265 lb 3.2 oz (120.3 kg)  11/07/21 268 lb (121.6 kg)    General: Alert, oriented, no distress.  Skin: normal turgor, no rashes, warm and dry HEENT: Normocephalic, atraumatic. Pupils equal round and reactive to light; sclera anicteric; extraocular muscles intact;  Nose without nasal septal hypertrophy Mouth/Parynx benign; Mallinpatti scale 3 Neck: No JVD, no carotid bruits; normal carotid upstroke Lungs: clear to ausculatation and percussion; no wheezing or rales Chest wall: without tenderness to palpitation Heart: PMI not displaced, RRR, s1 s2 normal, 1/6 systolic murmur, no diastolic murmur, no rubs, gallops, thrills, or heaves Abdomen: soft, nontender; no  hepatosplenomehaly, BS+; abdominal aorta nontender and not dilated by palpation. Back: no CVA tenderness Pulses 2+ Musculoskeletal: full range of motion, normal strength, no joint deformities Extremities: Left lower extremity  venous stasis changes.  No clubbing cyanosis or edema, Homan's sign negative  Neurologic: grossly nonfocal; Cranial nerves grossly wnl Psychologic: Normal mood and affect   Studies/Labs Reviewed:   December 1, 20232 ECG (independently read by me): NSR at 62, [facility]  November 08, 2021 ECG (independently read by me): NSR at 71, no ectopy  September 11, 2021 ECG (independently read by me):  NSR at 65; baseline artifact, no significant STT change; QTc 430 msec  Recent Labs:    Latest Ref Rng & Units 02/20/2022    8:47 AM 01/05/2013    8:28 AM 12/17/2012    8:45 AM  BMP  Glucose 70 - 99 mg/dL 96  90  105   BUN 8 - 27 mg/dL _0 Creatinine 0.57 - 1.00 mg/dL 0.86  0.69  0.75   BUN/Creat Ratio 12 - 28 15     Sodium 134 - 144 mmol/L 144  139  137   Potassium 3.5 - 5.2 mmol/L 4.6  4.4  3.6   Chloride 96 - 106 mmol/L 106  104  100   CO2 20 - 29 mmol/L _1 Calcium 8.7 - 10.3 mg/dL 8.8  8.7  9.8         Latest Ref Rng & Units 02/20/2022    8:47 AM  Hepatic Function  Total Protein 6.0 - 8.5 g/dL 6.3   Albumin 3.9 - 4.9 g/dL 3.7   AST 0 - 40 IU/L 15   ALT 0 - 32 IU/L 16   Alk Phosphatase 44 - 121 IU/L 118   Total Bilirubin 0.0 - 1.2 mg/dL 0.3        Latest Ref Rng & Units 02/20/2022    8:47 AM 06/30/2015    6:35 AM 12/17/2012    8:45 AM  CBC  WBC 3.4 - 10.8 x10E3/uL 6.0   7.5   Hemoglobin 11.1 - 15.9 g/dL 10.2  11.5  12.0   Hematocrit 34.0 - 46.6 % 32.0   37.4   Platelets 150 - 450 x10E3/uL 275   315    Lab Results  Component Value Date   MCV 79 02/20/2022   MCV 82.7 12/17/2012   Lab Results  Component Value Date   TSH 5.670 (H) 02/20/2022   Lab Results  Component Value Date   HGBA1C 6.2 (H) 02/20/2022     BNP No results found for:  "BNP"  ProBNP    Component Value Date/Time   PROBNP 22.8 12/17/2012 0845     Lipid Panel     Component Value Date/Time   CHOL 152 02/20/2022 0847   CHOL 188 01/05/2013 0828   TRIG 106 02/20/2022 0847   TRIG 136 01/05/2013 0828   HDL 51 02/20/2022 0847   HDL 49 01/05/2013 0828   CHOLHDL 3.0 02/20/2022 0847   LDLCALC 82 02/20/2022 0847   LDLCALC 112 (H) 01/05/2013 0828   LABVLDL 19 02/20/2022 0847     RADIOLOGY: No results found.   Additional studies/ records that were reviewed today include:  I reviewed my previous evaluation with last assessment in March 2017.  Records from [place], PA-C was reviewed  ECHO: 09/25/2021  1. Left ventricular ejection fraction, by estimation, is 55 to 60%. The  left ventricle has normal function. The left ventricle has no regional  wall motion abnormalities. Left ventricular diastolic parameters were  normal. The average left ventricular  global longitudinal strain is -19.1 %.  The global longitudinal strain is  normal.   2. Right ventricular systolic function is normal. The right ventricular  size is normal. There is normal pulmonary artery systolic pressure.   3. Left atrial size was mildly dilated.   4. The mitral valve is normal in structure. Trivial mitral valve  regurgitation. No evidence of mitral stenosis.   5. The aortic valve is tricuspid. There is mild calcification of the  aortic valve. There is mild thickening of the aortic valve. Aortic valve  regurgitation is mild. No aortic stenosis is present.   6. The inferior vena cava is normal in size with greater than 50%  respiratory variability, suggesting right atrial pressure of 3 mmHg.   Comparison(s): No significant change from prior study.   CT CARDIAC SCORE: 09/25/2021 FINDINGS: Coronary Calcium Score:   Left main: 0   Left anterior descending artery: 0   Left circumflex artery: 0   Right coronary artery: 0   Total: 0   Percentile:  NA   Pericardium: Normal.   Non-cardiac: See separate report from [facility].   IMPRESSION: Coronary calcium score of 0.   OVER-READ CT CHEST FINDINGS:   Cardiovascular: See dedicated report for cardiovascular details.   Mediastinum/Nodes: No thoracic inlet, axillary, mediastinal or hilar adenopathy. Esophagus grossly normal.   Lungs/Pleura: No acute process or adenopathy in visualized portions of the mediastinum.   Upper Abdomen: Lungs are clear and visualized airways are patent.   Musculoskeletal: Incidental imaging of upper abdominal contents without acute process.   IMPRESSION:   No acute or significant extracardiac findings.  -----------------------------------------------------------------------------------------------------   Patient Name: Tiffannie, Sloss Date: 11/07/2021 Gender: Female D.O.B: Sep 04, 1960 Age (years): 77 Referring Provider: Shelva Majestic MD, ABSM Height (inches): 71 Interpreting Physician: Shelva Majestic MD, ABSM Weight (lbs): 268 RPSGT: Jacolyn Reedy BMI: 37 MRN: [id number] Neck Size: 15.00   CLINICAL INFORMATION Sleep Study Type: HST   Indication for sleep study: snoring, difficulty sleeping on back, non-rewstorative sleep, nocturnal palpitations   Epworth Sleepiness Score: 5   SLEEP STUDY TECHNIQUE A multi-channel overnight portable sleep study was performed. The channels recorded were: nasal airflow, thoracic respiratory movement, and oxygen saturation with a pulse oximetry. Snoring was also monitored.   MEDICATIONS atorvastatin (LIPITOR) 10 MG tablet buPROPion (WELLBUTRIN XL) 150 MG 24 hr tablet Cholecalciferol (VITAMIN D3) 1.25 MG (50000 UT) CAPS levothyroxine (SYNTHROID) 25 MCG tablet loratadine (CLARITIN) 10 MG tablet pantoprazole (PROTONIX) 40 MG tablet valACYclovir (VALTREX) 1000 MG tablet Patient self administered medications include: N/A.   SLEEP ARCHITECTURE Patient was studied for 369.5 minutes. The  sleep efficiency was 100.0 % and the patient was supine for 0%. The arousal index was 0.0 per hour.   RESPIRATORY PARAMETERS The overall AHI was 14.1 per hour, with a central apnea index of 0 per hour. There was a significant positional component with supine sleep 27.6/h versus non-supine sleep at 2.0/h.    The oxygen nadir was 76% during sleep.   CARDIAC DATA Mean heart rate during sleep was 80.2 bpm.   IMPRESSIONS - Mild obstructive sleep apnea occurred during this study (AHI 14.1/h) with moderate sleep apnea during supine sleep (AHI 27.6/h). - Severe oxygen desaturation was noted during this study (Min O2 76%). - Minimal snoring was audible during this study. DIAGNOSIS - Obstructive Sleep Apnea (G47.33) - Nocturnal Hypoxemia (G47.36) RECOMMENDATIONS - Therapeutic CPAP titration to determine optimal pressure required to alleviate sleep disordered breathing. Recommend initiation of Auto-PAP with EPR of 3 at 6 - 16 cm of water.  -  Effort should be made to optimize nasal nads oropharyngeal patency. - Positional therapy avoiding supine position during sleep. - A customized oral appliance may be considered if patient is against CPAP initiation. - Avoid alcohol, sedatives and other CNS depressants that may worsen sleep apnea and disrupt normal sleep architecture. - Sleep hygiene should be reviewed to assess factors that may improve sleep quality. - Weight management (BMI 37) and regular exercise should be initiated or continued. - Recommend a download and sleep clinic evaluation after 4-6 weeks of therapy.      ASSESSMENT:    1. OSA (obstructive sleep apnea)   2. Family history of premature CAD   3. Atypical chest pain   4. Venous stasis of lower extremity   5. Obesity (BMI 35.0-39.9 without comorbidity)   6. Gastroesophageal reflux disease without esophagitis   7. Hypothyroidism, unspecified type     PLAN:  Ms. Miana Politte is a 61 year old female who has a history of  intermittent palpitations and in the past had experienced left-sided chest pain associated with mild shortness of breath.  A CT angiogram in 2014 was negative for PE.  A nuclear perfusion study revealed normal perfusion and function without scar or ischemia.  Stress EF was 69% and she walked 6 minutes and 45 seconds on a treadmill without chest pain or ECG changes.  An echo Doppler study of January 19, 2013 showed an EF of 55 to 60% with mild mitral annular calcification and trivial MR.  She had normal diastolic function  She has a family history for premature CAD with her father undergoing CABG revascularization surgery and also has documented abdominal aortic aneurysm.  In June 2023 she had experienced some nonexertional chest pain.  A subsequent calcium score was performed which was 0.  Chest CT over read was normal.  Due to concerns for obstructive sleep apnea she underwent a sleep study and was found to have mild to moderate overall sleep apnea with an AHI of 14.1.  Events were more prominent with supine sleep with an AHI of 27.6/h and she had significant oxygen desaturation to a nadir of 76%.  She has been on CPAP therapy since December 27, 2021 and has met compliance standards.  I reviewed her most recent download in detail.  Her 95th percentile pressure was 11 with maximum average pressure 12.6.  AHI was 2.8.  I am changing her CPAP pressures from 6 to 16 cm of water to a range of 10 to 16 cm of water based on her download.  I also recommended the ResMed F30i mask which I believe she will tolerate.  We discussed the importance of weight loss and exercise.  BMI is 38.9.  She has been on atorvastatin at very low dose.  She has not had recent laboratory and I will recheck fasting c-Met, CBC, hemoglobin A1c, TSH, lipid studies, and LP(a).  She has venous stasis changes in her left lower extremity with negative Homans' sign.  She is on pantoprazole for GERD. I will contact her regarding the results and we will see  her in 4 months for reevaluation.  Medication Adjustments/Labs and Tests Ordered: Current medicines are reviewed at length with the patient today.  Concerns regarding medicines are outlined above.  Medication changes, Labs and Tests ordered today are listed in the Patient Instructions below. Patient Instructions  Medication Instructions:  The current medical regimen is effective;  continue present plan and medications.  *If you need a refill on your cardiac medications before your  next appointment, please call your pharmacy*   Lab Work: Fasting lab work (CBC, CMET, TSH, LIPID, LPa, A1c)  If you have labs (blood work) drawn today and your tests are completely normal, you will receive your results only by: [place] (if you have MyChart) OR A paper copy in the mail If you have any lab test that is abnormal or we need to change your treatment, we will call you to review the results.  Follow-Up: At [facility], you and your health needs are our priority.  As part of our continuing mission to provide you with exceptional heart care, we have created designated Provider Care Teams.  These Care Teams include your primary Cardiologist (physician) and Advanced Practice Providers (APPs -  Physician Assistants and Nurse Practitioners) who all work together to provide you with the care you need, when you need it.  We recommend signing up for the patient portal called "MyChart".  Sign up information is provided on this After Visit Summary.  MyChart is used to connect with patients for Virtual Visits (Telemedicine).  Patients are able to view lab/test results, encounter notes, upcoming appointments, etc.  Non-urgent messages can be sent to your provider as well.   To learn more about what you can do with MyChart, go to [email or web address].    Your next appointment:   4 month(s)  The format for your next appointment:   In Person  Provider:   Shelva Majestic, MD             Signed, Shelva Majestic, MD, [facility], [place], American Board of Sleep Medicine  02/24/2022 10:16 AM    [facility] [address] [place], [place], [place]  [address] Phone: [phone]

## 2022-02-21 LAB — CBC
Hematocrit: 32 % — ABNORMAL LOW (ref 34.0–46.6)
Hemoglobin: 10.2 g/dL — ABNORMAL LOW (ref 11.1–15.9)
MCH: 25.3 pg — ABNORMAL LOW (ref 26.6–33.0)
MCHC: 31.9 g/dL (ref 31.5–35.7)
MCV: 79 fL (ref 79–97)
Platelets: 275 10*3/uL (ref 150–450)
RBC: 4.03 x10E6/uL (ref 3.77–5.28)
RDW: 14 % (ref 11.7–15.4)
WBC: 6 10*3/uL (ref 3.4–10.8)

## 2022-02-21 LAB — COMPREHENSIVE METABOLIC PANEL
ALT: 16 IU/L (ref 0–32)
AST: 15 IU/L (ref 0–40)
Albumin/Globulin Ratio: 1.4 (ref 1.2–2.2)
Albumin: 3.7 g/dL — ABNORMAL LOW (ref 3.9–4.9)
Alkaline Phosphatase: 118 IU/L (ref 44–121)
BUN/Creatinine Ratio: 15 (ref 12–28)
BUN: 13 mg/dL (ref 8–27)
Bilirubin Total: 0.3 mg/dL (ref 0.0–1.2)
CO2: 24 mmol/L (ref 20–29)
Calcium: 8.8 mg/dL (ref 8.7–10.3)
Chloride: 106 mmol/L (ref 96–106)
Creatinine, Ser: 0.86 mg/dL (ref 0.57–1.00)
Globulin, Total: 2.6 g/dL (ref 1.5–4.5)
Glucose: 96 mg/dL (ref 70–99)
Potassium: 4.6 mmol/L (ref 3.5–5.2)
Sodium: 144 mmol/L (ref 134–144)
Total Protein: 6.3 g/dL (ref 6.0–8.5)
eGFR: 77 mL/min/{1.73_m2} (ref >59)

## 2022-02-21 LAB — LIPID PANEL
Chol/HDL Ratio: 3 ratio (ref 0.0–4.4)
Cholesterol, Total: 152 mg/dL (ref 100–199)
HDL: 51 mg/dL (ref >39)
LDL Chol Calc (NIH): 82 mg/dL (ref 0–99)
Triglycerides: 106 mg/dL (ref 0–149)
VLDL Cholesterol Cal: 19 mg/dL (ref 5–40)

## 2022-02-21 LAB — LIPOPROTEIN A (LPA): Lipoprotein (a): 14.1 nmol/L (ref <75.0)

## 2022-02-21 LAB — HEMOGLOBIN A1C
Est. average glucose Bld gHb Est-mCnc: 131 mg/dL
Hgb A1c MFr Bld: 6.2 % — ABNORMAL HIGH (ref 4.8–5.6)

## 2022-02-21 LAB — TSH: TSH: 5.67 u[IU]/mL — ABNORMAL HIGH (ref 0.450–4.500)

## 2022-02-24 ENCOUNTER — Encounter: Payer: Self-pay | Admitting: Cardiovascular Disease

## 2022-02-28 ENCOUNTER — Other Ambulatory Visit: Payer: Self-pay

## 2022-02-28 MED ORDER — ATORVASTATIN CALCIUM 20 MG PO TABS: 20.0000 mg | ORAL_TABLET | Freq: Every day | ORAL | 3 refills | Status: DC

## 2022-06-25 ENCOUNTER — Encounter: Payer: Self-pay | Admitting: Cardiovascular Disease

## 2022-06-25 ENCOUNTER — Ambulatory Visit: Payer: Commercial Managed Care - PPO | Attending: Cardiovascular Disease | Admitting: Cardiovascular Disease

## 2022-06-25 VITALS — BP 143/84 | HR 70 | Ht 71.0 in | Wt 280.2 lb

## 2022-06-25 DIAGNOSIS — Z8249 Family history of ischemic heart disease and other diseases of the circulatory system: Secondary | ICD-10-CM

## 2022-06-25 DIAGNOSIS — E669 Obesity, unspecified: Secondary | ICD-10-CM

## 2022-06-25 DIAGNOSIS — G4733 Obstructive sleep apnea (adult) (pediatric): Secondary | ICD-10-CM

## 2022-06-25 DIAGNOSIS — E039 Hypothyroidism, unspecified: Secondary | ICD-10-CM

## 2022-06-25 DIAGNOSIS — R0789 Other chest pain: Secondary | ICD-10-CM

## 2022-06-25 DIAGNOSIS — K219 Gastro-esophageal reflux disease without esophagitis: Secondary | ICD-10-CM

## 2022-06-25 DIAGNOSIS — I878 Other specified disorders of veins: Secondary | ICD-10-CM

## 2022-06-25 NOTE — Patient Instructions (Signed)
Medication Instructions:  Your physician recommends that you continue on your current medications as directed. Please refer to the Current Medication list given to you today.  *If you need a refill on your cardiac medications before your next appointment, please call your pharmacy*  Follow-Up: At  [facility], you and your health needs are our priority.  As part of our continuing mission to provide you with exceptional heart care, we have created designated Provider Care Teams.  These Care Teams include your primary Cardiologist (physician) and Advanced Practice Providers (APPs -  Physician Assistants and Nurse Practitioners) who all work together to provide you with the care you need, when you need it.  We recommend signing up for the patient portal called "MyChart".  Sign up information is provided on this After Visit Summary.  MyChart is used to connect with patients for Virtual Visits (Telemedicine).  Patients are able to view lab/test results, encounter notes, upcoming appointments, etc.  Non-urgent messages can be sent to your provider as well.   To learn more about what you can do with MyChart, go to [email or web address].    Your next appointment:   12 month(s)  Provider:   Thomas Kelly, MD      

## 2022-06-25 NOTE — Progress Notes (Unsigned)
Cardiology Office Note    Date:  06/25/2022   ID:  Melinda Reeves, DOB [birth year], MRN [id number]  PCP:  Verlee Rossetti, PA-C  Cardiologist:  Nicki Guadalajara, MD   4 month F/U evaluation  History of Present Illness:  Melinda Reeves is a 62 y.o. female who is followed by Edger House, PA-C at [facility] at [facility]  I had seen her remotely and last saw her in March 2017.  She reestablished cardiology care with me on September 11, 2021.  I last saw her in August 2023.  She presents for 52-month follow-up cardiology/sleep evaluation.  Ms. Newland was initially seen by me in 2014 after she had experienced episodes of left-sided chest pain associated with mild episodes of shortness of breath.  She has a strong family history for premature CAD in her father who underwent CABG revascularization surgery and has a documented abdominal aortic aneurysm.  When seen in 2014, she had presented to the emergency room for chest discomfort.  Her ECG was unremarkable.  A CT angiogram was done due to right-sided pain which was negative for PE.  Cardiac enzymes were negative.  When I initially saw her, I recommended she undergo a 2D echo Doppler study as well as nuclear perfusion scan.  Her nuclear study done on January 07, 2013 showed normal perfusion and function without evidence for scar or ischemia.  A 2D echo Doppler study showed EF 55 to 65% with mild mitral annular calcification and normal diastolic function.  She has a history of mild asthma and is taking Symbicort and as needed albuterol.  I had seen her in 2017 for preoperative evaluation prior to undergoing left knee arthroscopic surgery by Dr. Linna Caprice.  I had not seen her since 2017.  She is now followed at [facility] at [place] [facility].  I saw her on September 11, 2021 which time she admitted that her heart rate would race when she would lay down.  She has a history of GERD and takes pantoprazole.  She admits to being under increased stress.  She  also experiences a stabbing pain in her left leg.  She admits to being active and cuts grass.  Oftentimes in the morning her heart rate can range from 70 to 114 bpm.  She also experiences some nonexertional chest pain while sitting which radiates to her left arm.  Upon further questioning, she does not sleep well and admits to snoring, nocturia 2 times per night and is postmenopausal.  For that evaluation, recommended that she undergo a 2D echo Doppler study for reassessment of LV systolic and diastolic function in addition to valvular architecture.  For assessment of potential CAD I recommended she undergo a coronary calcium score.  Due to concerns for obstructive sleep apnea I also recommended she undergo a home sleep study.  He had venous stasis changes to her left lower extremity.  Denna Haggard' sign was negative and there was no edema.  Her echo Doppler study was done on September 25, 2021 which showed an EF of 55 to 60%.  She had normal diastolic parameters and normal strain.  There was mild left atrial dilatation.  Mild calcification of her aortic valve with mild aortic regurgitation and no aortic stenosis.  There was trivial MR.  CT calcium score was excellent with a calcium score of 0.  Over read of her CT chest did not reveal any acute or significant extracardiac findings.  Today, she underwent  home sleep study.  I was able to interpret today.  This revealed mild overall sleep apnea with an AHI of 14.1/h.  Events were more significant with supine sleep with an AHI of 27.6/h.  O2 nadir was 76% and time spent less than 89% was 10.3 minutes.  Heart rate ranged from 64 to 118 bpm which was consistent with her prior concerns.  She denies chest pain or shortness of breath.  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    Since her last evaluation, CPAP was initiated with set up date on December 27, 2021 with [employer] as her DME company.  I obtained a download from October 11 through December 25, 2021 where she met compliance standards with 100% use and average usage 6 hours and 50 minutes.  Her pressure was set at a range of 6 to 16 cm of water.  AHI was 2.8.  However recently, she has been waking up at night and has been taking the mask off because she feels like she is suffocating.  An Epworth Sleepiness Scale score was calculated in the office today and this endorsed at 5 arguing against residual daytime sleepiness.  She continues to be on levothyroxine 25 mcg for hypothyroidism.  She is on low-dose atorvastatin 10 mg for hyperlipidemia.  She is on bupropion for anxiety/depression and pantoprazole for GERD.  She presents for follow-up evaluation.    Past Medical History:  Diagnosis Date   Acute meniscal tear of left knee    Environmental and seasonal allergies    GERD (gastroesophageal reflux disease)    Hyperlipidemia    PONV (postoperative nausea and vomiting)    SUI (stress urinary incontinence, female)    Wears glasses     Past Surgical History:  Procedure Laterality Date   CARDIOVASCULAR STRESS TEST  01-07-2013   normal nuclear perfusion study w/ no ischemia/  normal LV function and wall motion, ef 69%   CESAREAN SECTION  1996   EXCISION LEFT BREAST MASS  07-28-2004   benign   KNEE ARTHROSCOPY WITH LATERAL MENISECTOMY Left 06/30/2015   Procedure: LEFT KNEE ARTHROSCOPY WITH PARTIAL LATERAL MENISECTOMY;  Surgeon: Samson FredericBrian Swinteck, MD;  [facility];  Service: Orthopedics;  Laterality: Left;   PILONIDAL CYST EXCISION  1990   TRANSTHORACIC ECHOCARDIOGRAM  01-19-2013   normal LVF, ef 55-65%/  trivial MR and TR/  mild LAE    Current Medications: Outpatient Medications Prior to Visit  Medication Sig Dispense Refill   atorvastatin (LIPITOR) 20 MG tablet Take 1 tablet (20 mg total) by mouth daily. 90 tablet 3   buPROPion (WELLBUTRIN XL) 150 MG 24 hr tablet Take 150 mg by mouth daily.     cyanocobalamin (VITAMIN B12) 1000 MCG/ML injection Inject 1,000 mcg  into the muscle every 30 (thirty) days.     levothyroxine (SYNTHROID) 25 MCG tablet Take 25 mcg by mouth daily. Takes half (1/2) tablet daily     loratadine (CLARITIN) 10 MG tablet Take 1 tablet by mouth daily.     pantoprazole (PROTONIX) 40 MG tablet Take 40 mg by mouth daily.     valACYclovir (VALTREX) 1000 MG tablet Take 1,000 mg by mouth as needed (for fever blisters).     Cholecalciferol (VITAMIN D3) 1.25 MG (50000 UT) CAPS Take by mouth.     No facility-administered medications prior to visit.     Allergies:   Latex   Social History   Socioeconomic History   Marital  status: Married    Spouse name: Not on file   Number of children: Not on file   Years of education: Not on file   Highest education level: Not on file  Occupational History   Not on file  Tobacco Use   Smoking status: Former    Years: .5    Types: Cigarettes    Quit date: 06/27/1978    Years since quitting: 44.0   Smokeless tobacco: Never  Substance and Sexual Activity   Alcohol use: Yes    Alcohol/week: 1.0 standard drink of alcohol    Types: 1 Standard drinks or equivalent per week    Comment: social   Drug use: No   Sexual activity: Not on file  Other Topics Concern   Not on file  Social History Narrative   Not on file   Social Determinants of Health   Financial Resource Strain: Not on file  Food Insecurity: Not on file  Transportation Needs: Not on file  Physical Activity: Not on file  Stress: Not on file  Social Connections: Not on file    She works for [employer].  Family History:  The patient's family history includes Breast cancer in her maternal grandmother, mother, and paternal grandmother; COPD in her maternal grandmother; Cancer - Colon in her maternal grandfather, maternal grandmother, and mother; Diabetes in her father; Heart disease in her father, paternal grandfather, and paternal grandmother; Hyperlipidemia in her father; Hypertension in her father and paternal  grandmother; Uterine cancer in her mother.   Her father is 21 and had undergone CABG revascularization.  Mother died at age 91 with heart failure and possible colon cancer.  She has 2 sisters ages 8 and 38 and 1 brother age 53.  ROS General: Negative; No fevers, chills, or night sweats; obesity HEENT: Negative; No changes in vision or hearing, sinus congestion, difficulty swallowing Pulmonary: Negative; No cough, wheezing, shortness of breath, hemoptysis Cardiovascular: See HPI GI: GERD GU: Negative; No dysuria, hematuria, or difficulty voiding Musculoskeletal: Negative; no myalgias, joint pain, or weakness Hematologic/Oncology: Negative; no easy bruising, bleeding Endocrine: Negative; no heat/cold intolerance; no diabetes Neuro: Negative; no changes in balance, headaches Skin: Negative; No rashes or skin lesions Psychiatric: Negative; No behavioral problems, depression Sleep: Positive for OSA, now on CPAP therapy with set up date December 27, 2021.  Other comprehensive 14 point system review is negative.   PHYSICAL EXAM:   VS:  BP (!) 143/84   Pulse 70   Ht 5\' 11"  (1.803 m)   Wt 280 lb 3.2 oz (127.1 kg)   LMP 03/14/2011   BMI 39.08 kg/m     Repeat blood pressure by me 128/80  Wt Readings from Last 3 Encounters:  06/25/22 280 lb 3.2 oz (127.1 kg)  02/16/22 279 lb 3.2 oz (126.6 kg)  11/08/21 265 lb 3.2 oz (120.3 kg)    General: Alert, oriented, no distress.  Skin: normal turgor, no rashes, warm and dry HEENT: Normocephalic, atraumatic. Pupils equal round and reactive to light; sclera anicteric; extraocular muscles intact;  Nose without nasal septal hypertrophy Mouth/Parynx benign; Mallinpatti scale 3 Neck: No JVD, no carotid bruits; normal carotid upstroke Lungs: clear to ausculatation and percussion; no wheezing or rales Chest wall: without tenderness to palpitation Heart: PMI not displaced, RRR, s1 s2 normal, 1/6 systolic murmur, no diastolic murmur, no rubs, gallops,  thrills, or heaves Abdomen: soft, nontender; no hepatosplenomehaly, BS+; abdominal aorta nontender and not dilated by palpation. Back: no CVA tenderness Pulses 2+ Musculoskeletal:  full range of motion, normal strength, no joint deformities Extremities: Left lower extremity venous stasis changes.  No clubbing cyanosis or edema, Homan's sign negative  Neurologic: grossly nonfocal; Cranial nerves grossly wnl Psychologic: Normal mood and affect   Studies/Labs Reviewed:   December 1, 20232 ECG (independently read by me): NSR at 62, [facility]  November 08, 2021 ECG (independently read by me): NSR at 71, no ectopy  September 11, 2021 ECG (independently read by me):  NSR at 65; baseline artifact, no significant STT change; QTc 430 msec  Recent Labs:    Latest Ref Rng & Units 02/20/2022    8:47 AM 01/05/2013    8:28 AM 12/17/2012    8:45 AM  BMP  Glucose 70 - 99 mg/dL 96  90  264   BUN 8 - 27 mg/dL 13  18  14    Creatinine 0.57 - 1.00 mg/dL 1.58  3.09  4.07   BUN/Creat Ratio 12 - 28 15     Sodium 134 - 144 mmol/L 144  139  137   Potassium 3.5 - 5.2 mmol/L 4.6  4.4  3.6   Chloride 96 - 106 mmol/L 106  104  100   CO2 20 - 29 mmol/L 24  27  27    Calcium 8.7 - 10.3 mg/dL 8.8  8.7  9.8         Latest Ref Rng & Units 02/20/2022    8:47 AM  Hepatic Function  Total Protein 6.0 - 8.5 g/dL 6.3   Albumin 3.9 - 4.9 g/dL 3.7   AST 0 - 40 IU/L 15   ALT 0 - 32 IU/L 16   Alk Phosphatase 44 - 121 IU/L 118   Total Bilirubin 0.0 - 1.2 mg/dL 0.3        Latest Ref Rng & Units 02/20/2022    8:47 AM 06/30/2015    6:35 AM 12/17/2012    8:45 AM  CBC  WBC 3.4 - 10.8 x10E3/uL 6.0   7.5   Hemoglobin 11.1 - 15.9 g/dL 68.0  88.1  10.3   Hematocrit 34.0 - 46.6 % 32.0   37.4   Platelets 150 - 450 x10E3/uL 275   315    Lab Results  Component Value Date   MCV 79 02/20/2022   MCV 82.7 12/17/2012   Lab Results  Component Value Date   TSH 5.670 (H) 02/20/2022   Lab Results  Component Value Date   HGBA1C 6.2 (H)  02/20/2022     BNP No results found for: "BNP"  ProBNP    Component Value Date/Time   PROBNP 22.8 12/17/2012 0845     Lipid Panel     Component Value Date/Time   CHOL 152 02/20/2022 0847   CHOL 188 01/05/2013 0828   TRIG 106 02/20/2022 0847   TRIG 136 01/05/2013 0828   HDL 51 02/20/2022 0847   HDL 49 01/05/2013 0828   CHOLHDL 3.0 02/20/2022 0847   LDLCALC 82 02/20/2022 0847   LDLCALC 112 (H) 01/05/2013 0828   LABVLDL 19 02/20/2022 0847     RADIOLOGY: No results found.   Additional studies/ records that were reviewed today include:  I reviewed my previous evaluation with last assessment in March 2017.  Records from [facility], PA-C was reviewed  ECHO: 09/25/2021  1. Left ventricular ejection fraction, by estimation, is 55 to 60%. The  left ventricle has normal function. The left ventricle has no regional  wall motion abnormalities. Left ventricular diastolic parameters were  normal. The average left ventricular  global longitudinal strain is -19.1 %. The global longitudinal strain is  normal.   2. Right ventricular systolic function is normal. The right ventricular  size is normal. There is normal pulmonary artery systolic pressure.   3. Left atrial size was mildly dilated.   4. The mitral valve is normal in structure. Trivial mitral valve  regurgitation. No evidence of mitral stenosis.   5. The aortic valve is tricuspid. There is mild calcification of the  aortic valve. There is mild thickening of the aortic valve. Aortic valve  regurgitation is mild. No aortic stenosis is present.   6. The inferior vena cava is normal in size with greater than 50%  respiratory variability, suggesting right atrial pressure of 3 mmHg.   Comparison(s): No significant change from prior study.   CT CARDIAC SCORE: 09/25/2021 FINDINGS: Coronary Calcium Score:   Left main: 0   Left anterior descending artery: 0   Left circumflex artery: 0   Right  coronary artery: 0   Total: 0   Percentile: NA   Pericardium: Normal.   Non-cardiac: See separate report from [facility].   IMPRESSION: Coronary calcium score of 0.   OVER-READ CT CHEST FINDINGS:   Cardiovascular: See dedicated report for cardiovascular details.   Mediastinum/Nodes: No thoracic inlet, axillary, mediastinal or hilar adenopathy. Esophagus grossly normal.   Lungs/Pleura: No acute process or adenopathy in visualized portions of the mediastinum.   Upper Abdomen: Lungs are clear and visualized airways are patent.   Musculoskeletal: Incidental imaging of upper abdominal contents without acute process.   IMPRESSION:   No acute or significant extracardiac findings.  ---------------------------------------------------------------------------------------------   Patient Name: Melinda Reeves, Melinda Reeves Date: 11/07/2021 Gender: Female D.O.B: 01/21/1961 Age (years): 89 Referring Provider: Nicki Guadalajara MD, ABSM Height (inches): 71 Interpreting Physician: Nicki Guadalajara MD, ABSM Weight (lbs): 268 RPSGT: [place] BMI: 37 MRN: [id number] Neck Size: 15.00   CLINICAL INFORMATION Sleep Study Type: HST   Indication for sleep study: snoring, difficulty sleeping on back, non-rewstorative sleep, nocturnal palpitations   Epworth Sleepiness Score: 5   SLEEP STUDY TECHNIQUE A multi-channel overnight portable sleep study was performed. The channels recorded were: nasal airflow, thoracic respiratory movement, and oxygen saturation with a pulse oximetry. Snoring was also monitored.   MEDICATIONS atorvastatin (LIPITOR) 10 MG tablet buPROPion (WELLBUTRIN XL) 150 MG 24 hr tablet Cholecalciferol (VITAMIN D3) 1.25 MG (50000 UT) CAPS levothyroxine (SYNTHROID) 25 MCG tablet loratadine (CLARITIN) 10 MG tablet pantoprazole (PROTONIX) 40 MG tablet valACYclovir (VALTREX) 1000 MG tablet Patient self administered medications include: N/A.   SLEEP  ARCHITECTURE Patient was studied for 369.5 minutes. The sleep efficiency was 100.0 % and the patient was supine for 0%. The arousal index was 0.0 per hour.   RESPIRATORY PARAMETERS The overall AHI was 14.1 per hour, with a central apnea index of 0 per hour. There was a significant positional component with supine sleep 27.6/h versus non-supine sleep at 2.0/h.    The oxygen nadir was 76% during sleep.   CARDIAC DATA Mean heart rate during sleep was 80.2 bpm.   IMPRESSIONS - Mild obstructive sleep apnea occurred during this study (AHI 14.1/h) with moderate sleep apnea during supine sleep (AHI 27.6/h). - Severe oxygen desaturation was noted during this study (Min O2 76%). - Minimal snoring was audible during this study. DIAGNOSIS - Obstructive Sleep Apnea (G47.33) - Nocturnal Hypoxemia (G47.36) RECOMMENDATIONS - Therapeutic CPAP titration to determine optimal pressure required to alleviate sleep disordered breathing. Recommend initiation of  Auto-PAP with EPR of 3 at 6 - 16 cm of water.  - Effort should be made to optimize nasal nads oropharyngeal patency. - Positional therapy avoiding supine position during sleep. - A customized oral appliance may be considered if patient is against CPAP initiation. - Avoid alcohol, sedatives and other CNS depressants that may worsen sleep apnea and disrupt normal sleep architecture. - Sleep hygiene should be reviewed to assess factors that may improve sleep quality. - Weight management (BMI 37) and regular exercise should be initiated or continued. - Recommend a download and sleep clinic evaluation after 4-6 weeks of therapy.      ASSESSMENT:    No diagnosis found.   PLAN:  Ms. Melinda Reeves is a 61 year old female who has a history of intermittent palpitations and in the past had experienced left-sided chest pain associated with mild shortness of breath.  A CT angiogram in 2014 was negative for PE.  A nuclear perfusion study revealed normal perfusion  and function without scar or ischemia.  Stress EF was 69% and she walked 6 minutes and 45 seconds on a treadmill without chest pain or ECG changes.  An echo Doppler study of January 19, 2013 showed an EF of 55 to 60% with mild mitral annular calcification and trivial MR.  She had normal diastolic function  She has a family history for premature CAD with her father undergoing CABG revascularization surgery and also has documented abdominal aortic aneurysm.  In June 2023 she had experienced some nonexertional chest pain.  A subsequent calcium score was performed which was 0.  Chest CT over read was normal.  Due to concerns for obstructive sleep apnea she underwent a sleep study and was found to have mild to moderate overall sleep apnea with an AHI of 14.1.  Events were more prominent with supine sleep with an AHI of 27.6/h and she had significant oxygen desaturation to a nadir of 76%.  She has been on CPAP therapy since December 27, 2021 and has met compliance standards.  I reviewed her most recent download in detail.  Her 95th percentile pressure was 11 with maximum average pressure 12.6.  AHI was 2.8.  I am changing her CPAP pressures from 6 to 16 cm of water to a range of 10 to 16 cm of water based on her download.  I also recommended the ResMed F30i mask which I believe she will tolerate.  We discussed the importance of weight loss and exercise.  BMI is 38.9.  She has been on atorvastatin at very low dose.  She has not had recent laboratory and I will recheck fasting c-Met, CBC, hemoglobin A1c, TSH, lipid studies, and LP(a).  She has venous stasis changes in her left lower extremity with negative Homans' sign.  She is on pantoprazole for GERD. I will contact her regarding the results and we will see her in 4 months for reevaluation.  Medication Adjustments/Labs and Tests Ordered: Current medicines are reviewed at length with the patient today.  Concerns regarding medicines are outlined above.  Medication  changes, Labs and Tests ordered today are listed in the Patient Instructions below. There are no Patient Instructions on file for this visit.   Signed, Nicki Guadalajara, MD, [facility], ABSM Diplomate, American Board of Sleep Medicine  06/25/2022 12:42 PM    [facility] [address], [address], [place], [place]  [address] Phone: [phone]

## 2022-06-27 ENCOUNTER — Encounter: Payer: Self-pay | Admitting: Cardiovascular Disease

## 2022-09-12 ENCOUNTER — Encounter: Payer: Self-pay | Admitting: Podiatry

## 2022-09-12 ENCOUNTER — Ambulatory Visit (INDEPENDENT_AMBULATORY_CARE_PROVIDER_SITE_OTHER): Payer: Commercial Managed Care - PPO

## 2022-09-12 ENCOUNTER — Ambulatory Visit: Payer: Commercial Managed Care - PPO | Admitting: Podiatry

## 2022-09-12 VITALS — BP 152/81 | HR 61

## 2022-09-12 DIAGNOSIS — M779 Enthesopathy, unspecified: Secondary | ICD-10-CM

## 2022-09-12 DIAGNOSIS — M722 Plantar fascial fibromatosis: Secondary | ICD-10-CM

## 2022-09-12 DIAGNOSIS — B070 Plantar wart: Secondary | ICD-10-CM

## 2022-09-12 MED ORDER — TRIAMCINOLONE ACETONIDE 10 MG/ML IJ SUSP
10.0000 mg | Freq: Once | INTRAMUSCULAR | Status: AC
Start: 2022-09-12 — End: 2022-09-12
  Administered 2022-09-12: 10 mg

## 2022-09-12 NOTE — Progress Notes (Signed)
Subjective:   Patient ID: Melinda Reeves, female   DOB: 62 y.o.   MRN: [id number]   HPI Patient presents with a lot of pain on top of her left foot that is been going on for a long time gradually getting worse and has a very painful lesion of a few months duration on the outside of the left foot that is very sore and making it hard to walk and wear shoe gear comfortably.  Patient does not smoke likes to be active   Review of Systems  All other systems reviewed and are negative.       Objective:  Physical Exam Vitals and nursing note reviewed.  Constitutional:      Appearance: She is well-developed.  Pulmonary:     Effort: Pulmonary effort is normal.  Musculoskeletal:        General: Normal range of motion.  Skin:    General: Skin is warm.  Neurological:     Mental Status: She is alert.     Neurovascular status intact muscle strength was found to be adequate range of motion adequate with patient found to have inflammation pain of the midtarsal joint left with swelling of the area and a lesion on the lateral side of the left fifth metatarsal plantar that upon debridement shows pinpoint bleeding pain to lateral pressure.  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

## 2022-09-26 ENCOUNTER — Ambulatory Visit: Payer: Commercial Managed Care - PPO | Admitting: Podiatry

## 2022-09-26 ENCOUNTER — Encounter: Payer: Self-pay | Admitting: Podiatry

## 2022-09-26 DIAGNOSIS — M779 Enthesopathy, unspecified: Secondary | ICD-10-CM | POA: Diagnosis not present

## 2022-09-26 DIAGNOSIS — B070 Plantar wart: Secondary | ICD-10-CM

## 2022-09-26 NOTE — Progress Notes (Signed)
Subjective:   Patient ID: Melinda Reeves, female   DOB: 62 y.o.   MRN: [id number]   HPI Patient states the pain in the top of the left foot is doing so much better she is very happy and the lesion on the left is still present but improved from previous   ROS      Objective:  Physical Exam  Neurovascular status intact extensor tendinitis improved left with decreased swelling with keratotic lesion some fifth metatarsal proximal that still has a small amount of pinpoint bleeding but improved     Assessment:  Tendinitis left improved with injection and verruca plantaris plantar left     Plan:  H&P discussed conditions separately recommended topical medicines for the dorsal left foot ice as needed and periodic injections but I would like to make it at least 4 to 6 months.  For the lesion I did do debridement of the lesion I applied chemical agent to create immune response with sterile dressing.  Patient will be seen back to recheck

## 2023-03-04 ENCOUNTER — Other Ambulatory Visit: Payer: Self-pay | Admitting: Cardiovascular Disease

## 2023-05-07 ENCOUNTER — Other Ambulatory Visit (HOSPITAL_BASED_OUTPATIENT_CLINIC_OR_DEPARTMENT_OTHER): Payer: Self-pay | Admitting: Obstetrics and Gynecology

## 2023-05-07 DIAGNOSIS — E2839 Other primary ovarian failure: Secondary | ICD-10-CM

## 2023-05-22 ENCOUNTER — Encounter: Payer: Self-pay | Admitting: Podiatry

## 2023-05-22 ENCOUNTER — Ambulatory Visit: Payer: Commercial Managed Care - PPO | Admitting: Podiatry

## 2023-05-22 VITALS — Ht 71.0 in | Wt 280.0 lb

## 2023-05-22 DIAGNOSIS — M779 Enthesopathy, unspecified: Secondary | ICD-10-CM | POA: Diagnosis not present

## 2023-05-22 MED ORDER — TRIAMCINOLONE ACETONIDE 10 MG/ML IJ SUSP
10.0000 mg | Freq: Once | INTRAMUSCULAR | Status: AC
Start: 2023-05-22 — End: 2023-05-22
  Administered 2023-05-22: 10 mg via INTRA_ARTICULAR

## 2023-05-22 NOTE — Progress Notes (Signed)
 Subjective:   Patient ID: Melinda Reeves, female   DOB: 63 y.o.   MRN: [id number]   HPI Patient is getting a lot of pain on top of her left foot states it has been recent and may be interested in having the bone spur resected at 1 point   ROS      Objective:  Physical Exam  Neurovascular status intact with inflammation pain of the dorsal midtarsal joint with a sharp area with tendon nerve vessel going across this     Assessment:  Probability for arthritis with tendinitis condition     Plan:  H&P reviewed did do a careful injection of explaining risk 3 mg Dexasone Kenalog 5 mg Xylocaine over the extensor tendon complex and discussed possible bone spur resection with patient

## 2023-06-20 ENCOUNTER — Ambulatory Visit (HOSPITAL_BASED_OUTPATIENT_CLINIC_OR_DEPARTMENT_OTHER)
Admission: RE | Admit: 2023-06-20 | Discharge: 2023-06-20 | Disposition: A | Discharge status: Home / Self Care | Source: Ambulatory Visit | Attending: Obstetrics and Gynecology | Admitting: Obstetrics and Gynecology

## 2023-06-20 DIAGNOSIS — E2839 Other primary ovarian failure: Secondary | ICD-10-CM | POA: Insufficient documentation
